# Patient Record
Sex: Female | Born: 1990 | Race: Asian | Hispanic: No | Marital: Married | Smoking: Never smoker
Health system: Southern US, Community
[De-identification: ages and names within clinical notes are randomized; demographics above are authoritative.]

## PROBLEM LIST (undated history)

## (undated) DIAGNOSIS — L309 Dermatitis, unspecified: Secondary | ICD-10-CM

## (undated) HISTORY — PX: ABDOMINAL SURGERY: SHX537

---

## 2011-06-26 ENCOUNTER — Encounter (HOSPITAL_COMMUNITY): Payer: Self-pay | Admitting: *Deleted

## 2011-06-26 ENCOUNTER — Emergency Department (HOSPITAL_COMMUNITY)
Admission: EM | Admit: 2011-06-26 | Discharge: 2011-06-26 | Disposition: A | Discharge status: Home / Self Care | Payer: Medicaid Other | Attending: Emergency Medicine | Admitting: Emergency Medicine

## 2011-06-26 DIAGNOSIS — L259 Unspecified contact dermatitis, unspecified cause: Secondary | ICD-10-CM | POA: Insufficient documentation

## 2011-06-26 DIAGNOSIS — L309 Dermatitis, unspecified: Secondary | ICD-10-CM

## 2011-06-26 HISTORY — DX: Dermatitis, unspecified: L30.9

## 2011-06-26 MED ORDER — HYDROXYZINE PAMOATE 25 MG PO CAPS: ORAL_CAPSULE | ORAL | Status: DC

## 2011-06-26 MED ORDER — DIPHENHYDRAMINE HCL 25 MG PO CAPS
25.0000 mg | ORAL_CAPSULE | Freq: Once | ORAL | Status: AC
  Administered 2011-06-26: 25 mg via ORAL
  Filled 2011-06-26: qty 1

## 2011-06-26 MED ORDER — PREDNISONE 10 MG PO TABS: ORAL_TABLET | ORAL | Status: DC

## 2011-06-26 MED ORDER — METHYLPREDNISOLONE SODIUM SUCC 125 MG IJ SOLR
125.0000 mg | Freq: Once | INTRAMUSCULAR | Status: AC
  Administered 2011-06-26: 125 mg via INTRAMUSCULAR
  Filled 2011-06-26: qty 2

## 2011-06-26 MED ORDER — METHYLPREDNISOLONE SODIUM SUCC 125 MG IJ SOLR: 125.0000 mg | Freq: Once | INTRAMUSCULAR | Status: DC

## 2011-06-26 NOTE — Discharge Instructions (Signed)

## 2011-06-26 NOTE — ED Provider Notes (Signed)
History     CSN: [id number]  Arrival date & time 06/26/11  1645   None     Chief Complaint  Patient presents with  . Rash    (Consider location/radiation/quality/duration/timing/severity/associated sxs/prior treatment) Patient is a 21 y.o. female presenting with rash. The history is provided by the patient.  Rash  This is a new problem. The current episode started 2 days ago. The problem has been gradually worsening. Associated with: history of eczema. There has been no fever. The pain is moderate. Pain course: itching. Associated symptoms include itching. Pertinent negatives include no pain and no weeping. She has tried nothing for the symptoms. The treatment provided no relief.    Past Medical History  Diagnosis Date  . Eczema     Past Surgical History  Procedure Date  . Abdominal surgery     No family history on file.  History  Substance Use Topics  . Smoking status: Never Smoker   . Smokeless tobacco: Not on file  . Alcohol Use: No    OB History    Grav Para Term Preterm Abortions TAB SAB Ect Mult Living                  Review of Systems  Constitutional: Negative for activity change.       All ROS Neg except as noted in HPI  HENT: Negative for nosebleeds and neck pain.   Eyes: Negative for photophobia and discharge.  Respiratory: Negative for cough, shortness of breath and wheezing.   Cardiovascular: Negative for chest pain and palpitations.  Gastrointestinal: Negative for abdominal pain and blood in stool.  Genitourinary: Negative for dysuria, frequency and hematuria.  Musculoskeletal: Negative for back pain and arthralgias.  Skin: Positive for itching and rash.  Neurological: Negative for dizziness, seizures and speech difficulty.  Psychiatric/Behavioral: Negative for hallucinations and confusion.    Allergies  Review of patient's allergies indicates no known allergies.  Home Medications  No current outpatient prescriptions on file.  BP 106/58   Pulse 75  Temp(Src) 98.3 F (36.8 C) (Oral)  Resp 20  Ht 5' (1.524 m)  Wt 125 lb 9.6 oz (56.972 kg)  BMI 24.53 kg/m2  SpO2 99%  LMP 06/16/2011  Physical Exam  Nursing note and vitals reviewed. Constitutional: She is oriented to person, place, and time. She appears well-developed and well-nourished.  Non-toxic appearance.  HENT:  Head: Normocephalic.  Right Ear: Tympanic membrane and external ear normal.  Left Ear: Tympanic membrane and external ear normal.  Eyes: EOM and lids are normal. Pupils are equal, round, and reactive to light.  Neck: Normal range of motion. Neck supple. Carotid bruit is not present.       Rash on the back of the neck. No active hives present.  Cardiovascular: Normal rate, regular rhythm, normal heart sounds, intact distal pulses and normal pulses.   Pulmonary/Chest: Breath sounds normal. No respiratory distress.  Abdominal: Soft. Bowel sounds are normal. There is no tenderness. There is no guarding.  Musculoskeletal: Normal range of motion.       Red papular rash of the right and left inner thighes. No pubis involvement. Chaperone in room during exam.  Lymphadenopathy:       Head (right side): No submandibular adenopathy present.       Head (left side): No submandibular adenopathy present.    She has no cervical adenopathy.  Neurological: She is alert and oriented to person, place, and time. She has normal strength. No cranial nerve deficit  or sensory deficit.  Skin: Skin is warm and dry. Rash noted.  Psychiatric: She has a normal mood and affect. Her speech is normal.    ED Course  Procedures (including critical care time)  Labs Reviewed - No data to display No results found.   No diagnosis found.    MDM  I have reviewed nursing notes, vital signs, and all appropriate lab and imaging results for this patient.  Rx Prednisone and Vistaril given. Pt to see dermatologist if not improving.      Kathie Dike, [place] 06/26/11 [phone]

## 2011-06-26 NOTE — ED Notes (Signed)
Hx of eczema - reports rash to medial bil thighs and back of neck x 2 days with itching and burning.

## 2011-06-26 NOTE — ED Notes (Signed)
Pt has moved from out of town and has hx of eczema, c/o of it spreading to inner thighs, tried to be seen at Urgent Care and was told to come here for evaluation

## 2011-06-28 NOTE — ED Provider Notes (Signed)
Medical screening examination/treatment/procedure(s) were performed by non-physician practitioner and as supervising physician I was immediately available for consultation/collaboration.  Dezeray Puccio, MD 06/28/11 1409 

## 2016-01-11 ENCOUNTER — Encounter (HOSPITAL_COMMUNITY): Payer: Self-pay | Admitting: Emergency Medicine

## 2016-01-11 ENCOUNTER — Emergency Department (HOSPITAL_COMMUNITY)
Admission: EM | Admit: 2016-01-11 | Discharge: 2016-01-11 | Disposition: A | Discharge status: Home / Self Care | Payer: Medicaid Other | Attending: Emergency Medicine | Admitting: Emergency Medicine

## 2016-01-11 DIAGNOSIS — T50905A Adverse effect of unspecified drugs, medicaments and biological substances, initial encounter: Secondary | ICD-10-CM

## 2016-01-11 DIAGNOSIS — Z79899 Other long term (current) drug therapy: Secondary | ICD-10-CM | POA: Insufficient documentation

## 2016-01-11 DIAGNOSIS — T43202A Poisoning by unspecified antidepressants, intentional self-harm, initial encounter: Secondary | ICD-10-CM | POA: Diagnosis not present

## 2016-01-11 DIAGNOSIS — T7840XA Allergy, unspecified, initial encounter: Secondary | ICD-10-CM | POA: Diagnosis present

## 2016-01-11 LAB — BASIC METABOLIC PANEL
Anion gap: 6 (ref 5–15)
BUN: 17 mg/dL (ref 6–20)
CALCIUM: 8.9 mg/dL (ref 8.9–10.3)
CO2: 22 mmol/L (ref 22–32)
CREATININE: 0.73 mg/dL (ref 0.44–1.00)
Chloride: 102 mmol/L (ref 101–111)
GFR calc Af Amer: >60 mL/min (ref >60)
GLUCOSE: 126 mg/dL — AB (ref 65–99)
Potassium: 3.2 mmol/L — ABNORMAL LOW (ref 3.5–5.1)
SODIUM: 130 mmol/L — AB (ref 135–145)

## 2016-01-11 LAB — CBC WITH DIFFERENTIAL/PLATELET
BASOS ABS: 0.1 10*3/uL (ref 0.0–0.1)
Basophils Relative: 1 %
EOS ABS: 0 10*3/uL (ref 0.0–0.7)
EOS PCT: 0 %
HCT: 39.3 % (ref 36.0–46.0)
Hemoglobin: 13.7 g/dL (ref 12.0–15.0)
LYMPHS PCT: 12 %
Lymphs Abs: 1.2 10*3/uL (ref 0.7–4.0)
MCH: 30.5 pg (ref 26.0–34.0)
MCHC: 34.9 g/dL (ref 30.0–36.0)
MCV: 87.5 fL (ref 78.0–100.0)
MONO ABS: 0.4 10*3/uL (ref 0.1–1.0)
Monocytes Relative: 4 %
Neutro Abs: 8.1 10*3/uL — ABNORMAL HIGH (ref 1.7–7.7)
Neutrophils Relative %: 83 %
PLATELETS: 289 10*3/uL (ref 150–400)
RBC: 4.49 MIL/uL (ref 3.87–5.11)
RDW: 11.9 % (ref 11.5–15.5)
WBC: 9.7 10*3/uL (ref 4.0–10.5)

## 2016-01-11 LAB — URINALYSIS, ROUTINE W REFLEX MICROSCOPIC
BILIRUBIN URINE: NEGATIVE
Glucose, UA: NEGATIVE mg/dL
HGB URINE DIPSTICK: NEGATIVE
KETONES UR: NEGATIVE mg/dL
NITRITE: NEGATIVE
PROTEIN: NEGATIVE mg/dL
pH: 5.5 (ref 5.0–8.0)

## 2016-01-11 LAB — URINE MICROSCOPIC-ADD ON: RBC / HPF: NONE SEEN RBC/hpf (ref 0–5)

## 2016-01-11 LAB — PREGNANCY, URINE: PREG TEST UR: NEGATIVE

## 2016-01-11 MED ORDER — DIPHENHYDRAMINE HCL 25 MG PO CAPS
ORAL_CAPSULE | ORAL | Status: AC
  Filled 2016-01-11: qty 1

## 2016-01-11 MED ORDER — SODIUM CHLORIDE 0.9 % IV BOLUS (SEPSIS)
1000.0000 mL | Freq: Once | INTRAVENOUS | Status: AC
  Administered 2016-01-11: 1000 mL via INTRAVENOUS

## 2016-01-11 MED ORDER — ONDANSETRON HCL 4 MG/2ML IJ SOLN
4.0000 mg | Freq: Once | INTRAMUSCULAR | Status: AC
  Administered 2016-01-11: 4 mg via INTRAVENOUS
  Filled 2016-01-11: qty 2

## 2016-01-11 MED ORDER — DIPHENHYDRAMINE HCL 25 MG PO CAPS
25.0000 mg | ORAL_CAPSULE | Freq: Once | ORAL | Status: AC
  Administered 2016-01-11: 25 mg via ORAL

## 2016-01-11 MED ORDER — LORAZEPAM 2 MG/ML IJ SOLN
0.5000 mg | Freq: Once | INTRAMUSCULAR | Status: AC
  Administered 2016-01-11: 0.5 mg via INTRAVENOUS
  Filled 2016-01-11: qty 1

## 2016-01-11 NOTE — ED Triage Notes (Signed)
Pt reports possible allergic reaction to Zoloft, started taking today. Pt states she feels like "her throat is closing up. Pt 100 % on RA. Pt able to speak clearly. No stridor heard on auscultation. Lung sounds clear.

## 2016-01-11 NOTE — ED Provider Notes (Signed)
[facility]PT Provider Note   CSN: [id number] Arrival date & time: 01/11/16  2015  By signing my name below, I, Linna DarnerRussell Turner, attest that this documentation has been prepared under the direction and in the presence of physician practitioner, Jacalyn LefevreJulie Alveena Taira, MD. Electronically Signed: Linna Darnerussell Turner, Scribe. 01/11/2016. 8:59 PM.  History   Chief Complaint Chief Complaint  Patient presents with  . Allergic Reaction    The history is provided by the patient. No language interpreter was used.     HPI Comments: Hannah Singh is a 25 y.o. female who presents to the Emergency Department complaining of allergic reaction s/p taking Zoloft several hours ago. Pt reports she took Zoloft for the first time this evening and felt like she was going to pass out shortly after taking it; she endorses numbness, lightheadedness, nausea, and palpitations. She states it felt like her throat was closing up but her throat is starting to feel better. She notes she has had intermittent nausea and palpitations since arrival to the ED. She states she still feels numbness in her arms. No treatments or medications tried PTA. LMP October 8th. She denies vomiting, fever, chills, or any other associated symptoms.  Past Medical History:  Diagnosis Date  . Eczema     There are no active problems to display for this patient.   Past Surgical History:  Procedure Laterality Date  . ABDOMINAL SURGERY      OB History    Gravida Para Term Preterm AB Living   2 2 1 1        SAB TAB Ectopic Multiple Live Births                   Home Medications    Prior to Admission medications   Medication Sig Start Date End Date Taking? Authorizing Provider  sertraline (ZOLOFT) 50 MG tablet Take 50 mg by mouth daily.   Yes Historical Provider, MD    Family History History reviewed. No pertinent family history.  Social History Social History  Substance Use Topics  . Smoking status: Never Smoker  . Smokeless  tobacco: Never Used  . Alcohol use No     Allergies   Review of patient's allergies indicates no known allergies.   Review of Systems Review of Systems  Constitutional: Negative for chills and fever.  Cardiovascular: Positive for palpitations.  Gastrointestinal: Positive for nausea. Negative for vomiting.  Neurological: Positive for light-headedness and numbness.  All other systems reviewed and are negative.   Physical Exam Updated Vital Signs BP 110/60   Pulse 109   Temp 97.3 F (36.3 C)   Resp 23   Ht 5' (1.524 m)   Wt 123 lb (55.8 kg)   LMP 12/17/2015   SpO2 99%   BMI 24.02 kg/m   Physical Exam  Constitutional: She is oriented to person, place, and time. She appears well-developed and well-nourished. No distress.  HENT:  Head: Normocephalic and atraumatic.  Eyes: Conjunctivae and EOM are normal.  Neck: Neck supple. No tracheal deviation present.  Cardiovascular: Normal rate.   Pulmonary/Chest: Effort normal. No respiratory distress.  Musculoskeletal: Normal range of motion.  Neurological: She is alert and oriented to person, place, and time.  Skin: Skin is warm and dry.  Psychiatric: She has a normal mood and affect. Her behavior is normal.  Nursing note and vitals reviewed.    ED Treatments / Results  Labs (all labs ordered are listed, but only abnormal results are displayed) Labs Reviewed  URINALYSIS, ROUTINE  W REFLEX MICROSCOPIC (NOT AT [facility]) - Abnormal; Notable for the following:       Result Value   Specific Gravity, Urine <1.005 (*)    Leukocytes, UA TRACE (*)    All other components within normal limits  BASIC METABOLIC PANEL - Abnormal; Notable for the following:    Sodium 130 (*)    Potassium 3.2 (*)    Glucose, Bld 126 (*)    All other components within normal limits  CBC WITH DIFFERENTIAL/PLATELET - Abnormal; Notable for the following:    Neutro Abs 8.1 (*)    All other components within normal limits  URINE MICROSCOPIC-ADD ON - Abnormal;  Notable for the following:    Squamous Epithelial / LPF 6-30 (*)    Bacteria, UA RARE (*)    All other components within normal limits  PREGNANCY, URINE    EKG  EKG Interpretation None       Radiology No results found.  Procedures Procedures (including critical care time)  DIAGNOSTIC STUDIES: Oxygen Saturation is 100% on RA, normal by my interpretation.    COORDINATION OF CARE: 8:59 PM Discussed treatment plan with pt at bedside and pt agreed to plan.  Medications Ordered in ED Medications  sodium chloride 0.9 % bolus 1,000 mL (1,000 mLs Intravenous New Bag/Given 01/11/16 2130)  LORazepam (ATIVAN) injection 0.5 mg (0.5 mg Intravenous Given 01/11/16 2129)  ondansetron (ZOFRAN) injection 4 mg (4 mg Intravenous Given 01/11/16 2130)     Initial Impression / Assessment and Plan / ED Course  I have reviewed the triage vital signs and the nursing notes.  Pertinent labs & imaging results that were available during my care of the patient were reviewed by me and considered in my medical decision making (see chart for details).  Clinical Course   Pt is improving.  She knows to return if worse.  I personally performed the services described in this documentation, which was scribed in my presence. The recorded information has been reviewed and is accurate.   Final Clinical Impressions(s) / ED Diagnoses   Final diagnoses:  Medication reaction, initial encounter    New Prescriptions New Prescriptions   No medications on file     Jacalyn LefevreJulie Letti Towell, MD 01/11/16 2223

## 2016-01-11 NOTE — ED Notes (Signed)
Pt with hives to area of plastic  tape after taking her IV out, pt c/o itching.  EDP made aware and has seen pt's hives

## 2016-04-10 ENCOUNTER — Telehealth (HOSPITAL_COMMUNITY): Payer: Self-pay | Admitting: *Deleted

## 2016-04-10 NOTE — Telephone Encounter (Signed)
spoke with patient regarding an appointment, she said she is feeling better and do not wish to schedule at this time.

## 2016-04-10 NOTE — Telephone Encounter (Signed)
spoke with patient

## 2016-08-01 ENCOUNTER — Other Ambulatory Visit (HOSPITAL_COMMUNITY): Payer: Self-pay

## 2016-08-01 ENCOUNTER — Emergency Department (HOSPITAL_COMMUNITY): Payer: Medicaid Other

## 2016-08-01 ENCOUNTER — Encounter (HOSPITAL_COMMUNITY): Payer: Self-pay | Admitting: Emergency Medicine

## 2016-08-01 ENCOUNTER — Emergency Department (HOSPITAL_COMMUNITY)
Admission: EM | Admit: 2016-08-01 | Discharge: 2016-08-01 | Disposition: A | Discharge status: Home / Self Care | Payer: Medicaid Other | Attending: Emergency Medicine | Admitting: Emergency Medicine

## 2016-08-01 DIAGNOSIS — M542 Cervicalgia: Secondary | ICD-10-CM | POA: Insufficient documentation

## 2016-08-01 DIAGNOSIS — Z79899 Other long term (current) drug therapy: Secondary | ICD-10-CM | POA: Insufficient documentation

## 2016-08-01 LAB — CBC WITH DIFFERENTIAL/PLATELET
BASOS PCT: 0 %
Basophils Absolute: 0 10*3/uL (ref 0.0–0.1)
EOS ABS: 0.1 10*3/uL (ref 0.0–0.7)
EOS PCT: 1 %
HEMATOCRIT: 43.5 % (ref 36.0–46.0)
Hemoglobin: 15 g/dL (ref 12.0–15.0)
Lymphocytes Relative: 17 %
Lymphs Abs: 1.5 10*3/uL (ref 0.7–4.0)
MCH: 30.5 pg (ref 26.0–34.0)
MCHC: 34.5 g/dL (ref 30.0–36.0)
MCV: 88.6 fL (ref 78.0–100.0)
MONOS PCT: 5 %
Monocytes Absolute: 0.5 10*3/uL (ref 0.1–1.0)
Neutro Abs: 6.9 10*3/uL (ref 1.7–7.7)
Neutrophils Relative %: 77 %
PLATELETS: 320 10*3/uL (ref 150–400)
RBC: 4.91 MIL/uL (ref 3.87–5.11)
RDW: 12.2 % (ref 11.5–15.5)
WBC: 9.1 10*3/uL (ref 4.0–10.5)

## 2016-08-01 LAB — BASIC METABOLIC PANEL
Anion gap: 7 (ref 5–15)
BUN: 11 mg/dL (ref 6–20)
CALCIUM: 9.2 mg/dL (ref 8.9–10.3)
CO2: 28 mmol/L (ref 22–32)
CREATININE: 0.76 mg/dL (ref 0.44–1.00)
Chloride: 102 mmol/L (ref 101–111)
GFR calc non Af Amer: >60 mL/min (ref >60)
Glucose, Bld: 101 mg/dL — ABNORMAL HIGH (ref 65–99)
Potassium: 3.8 mmol/L (ref 3.5–5.1)
SODIUM: 137 mmol/L (ref 135–145)

## 2016-08-01 LAB — TSH: TSH: 0.412 u[IU]/mL (ref 0.350–4.500)

## 2016-08-01 NOTE — Discharge Instructions (Signed)
Tests including blood work and ultrasound showed no life-threatening condition. Follow-up your primary care doctor

## 2016-08-01 NOTE — ED Triage Notes (Signed)
Pt states 2 weeks ago felt a pop with pain to right side of neck. Pain since with shooting pains down r side of neck to right chest with lightheadedness today. nad at this time. Denies injury

## 2016-08-01 NOTE — ED Notes (Signed)
Patient transported to Ultrasound 

## 2016-08-01 NOTE — ED Provider Notes (Signed)
[facility] Provider Note   CSN: [id number] Arrival date & time: 08/01/16  1342     History   Chief Complaint Chief Complaint  Patient presents with  . Neck Pain    HPI Hannah Singh is a 26 y.o. female.  Patient complains of pain in her anterior neck for 2 weeks. Her neck feels full. No meningeal signs. No fever, sweats, chills. She is normally healthy. No history of thyroid pathology. Symptoms started with a popping sensation in the right side of her neck.  No chest pain or dyspnea. Review systems positive for intermittent rare dizziness.  Past medical history negative      Past Medical History:  Diagnosis Date  . Eczema     There are no active problems to display for this patient.   Past Surgical History:  Procedure Laterality Date  . ABDOMINAL SURGERY    . ABDOMINAL SURGERY     "gastroscehsis"    OB History    Gravida Para Term Preterm AB Living   2 2 1 1        SAB TAB Ectopic Multiple Live Births                   Home Medications    Prior to Admission medications   Medication Sig Start Date End Date Taking? Authorizing Provider  cycloSPORINE (RESTASIS) 0.05 % ophthalmic emulsion Place 1 drop into both eyes 2 (two) times daily.   Yes [provider]  loratadine-pseudoephedrine (CLARITIN-D 24-HOUR) 10-240 MG 24 hr tablet Take 1 tablet by mouth daily.   Yes [provider]  Tetrahydrozoline HCl (EYE DROPS OP) Apply 1 drop to eye daily. Dry eyes   Yes [provider]    Family History History reviewed. No pertinent family history.  Social History Social History  Substance Use Topics  . Smoking status: Never Smoker  . Smokeless tobacco: Never Used  . Alcohol use No     Allergies   Sertraline and Tape   Review of Systems Review of Systems  All other systems reviewed and are negative.    Physical Exam Updated Vital Signs BP 128/71 (BP Location: Right Arm)   Pulse 87   Temp 98.4 F (36.9 C) (Oral)    Resp 16   Ht 5' (1.524 m)   Wt 56.2 kg (124 lb)   LMP 07/25/2016   SpO2 99%   BMI 24.22 kg/m   Physical Exam  Constitutional: She is oriented to person, place, and time. She appears well-developed and well-nourished.  HENT:  Head: Normocephalic and atraumatic.  Eyes: Conjunctivae are normal.  Neck: Neck supple.  No nodes or masses noted in the anterior neck. No meningeal signs  Cardiovascular: Normal rate and regular rhythm.   Pulmonary/Chest: Effort normal and breath sounds normal.  Abdominal: Soft. Bowel sounds are normal.  Musculoskeletal: Normal range of motion.  Neurological: She is alert and oriented to person, place, and time.  Skin: Skin is warm and dry.  Psychiatric: She has a normal mood and affect. Her behavior is normal.  Nursing note and vitals reviewed.    ED Treatments / Results  Labs (all labs ordered are listed, but only abnormal results are displayed) Labs Reviewed  BASIC METABOLIC PANEL - Abnormal; Notable for the following:       Result Value   Glucose, Bld 101 (*)    All other components within normal limits  CBC WITH DIFFERENTIAL/PLATELET  TSH    EKG  EKG Interpretation None  Radiology [place] Soft Tissue Neck  Result Date: 08/01/2016 CLINICAL DATA:  26 year old female with a history of swelling right mid anterior neck EXAM: ULTRASOUND OF HEAD/NECK SOFT TISSUES TECHNIQUE: Ultrasound examination of the head and neck soft tissues was performed in the area of clinical concern. COMPARISON:  None. FINDINGS: Grayscale and color duplex ultrasound performed in the region of clinical concern of the right anterior neck. No focal fluid collection. No soft tissue lesion. Normal vasculature identified. Lymph nodes are present, not enlarged. IMPRESSION: Unremarkable sonographic survey of the right neck in the region of clinical concern. Electronically Signed   By: Gilmer MorJaime  Wagner D.O.   On: 08/01/2016 15:37    Procedures Procedures (including critical care  time)  Medications Ordered in ED Medications - No data to display   Initial Impression / Assessment and Plan / ED Course  I have reviewed the triage vital signs and the nursing notes.  Pertinent labs & imaging results that were available during my care of the patient were reviewed by me and considered in my medical decision making (see chart for details).     Patient is in no acute distress. Screening tests including ultrasound, labs, TSH all negative. This was discussed with the patient.  Final Clinical Impressions(s) / ED Diagnoses   Final diagnoses:  Neck pain    New Prescriptions New Prescriptions   No medications on file     Donnetta Hutchingook, Keala Drum, MD 08/01/16 1710

## 2017-10-05 ENCOUNTER — Emergency Department (HOSPITAL_COMMUNITY)

## 2017-10-05 ENCOUNTER — Emergency Department (HOSPITAL_COMMUNITY)
Admission: EM | Admit: 2017-10-05 | Discharge: 2017-10-05 | Disposition: A | Discharge status: Home / Self Care | Attending: Emergency Medicine | Admitting: Emergency Medicine

## 2017-10-05 ENCOUNTER — Encounter (HOSPITAL_COMMUNITY): Payer: Self-pay | Admitting: *Deleted

## 2017-10-05 DIAGNOSIS — Y998 Other external cause status: Secondary | ICD-10-CM | POA: Diagnosis not present

## 2017-10-05 DIAGNOSIS — Y9301 Activity, walking, marching and hiking: Secondary | ICD-10-CM | POA: Diagnosis not present

## 2017-10-05 DIAGNOSIS — S93602A Unspecified sprain of left foot, initial encounter: Secondary | ICD-10-CM | POA: Diagnosis not present

## 2017-10-05 DIAGNOSIS — Y929 Unspecified place or not applicable: Secondary | ICD-10-CM | POA: Insufficient documentation

## 2017-10-05 DIAGNOSIS — S99912A Unspecified injury of left ankle, initial encounter: Secondary | ICD-10-CM | POA: Diagnosis present

## 2017-10-05 DIAGNOSIS — Y33XXXA Other specified events, undetermined intent, initial encounter: Secondary | ICD-10-CM | POA: Diagnosis not present

## 2017-10-05 MED ORDER — IBUPROFEN 600 MG PO TABS: 600.0000 mg | ORAL_TABLET | Freq: Four times a day (QID) | ORAL | 0 refills | Status: DC | PRN

## 2017-10-05 NOTE — ED Provider Notes (Signed)
[facility]ENCY DEPART[facility] Provider Note   CSN: [id number] Arrival date & time: 10/05/17  1133     History   Chief Complaint Chief Complaint  Patient presents with  . Ankle Pain    HPI Hannah Singh is a 27 y.o. female  Presenting with left foot and ankle pain which occurred suddenly when the patient inverted her left foot and ankle when walking wearing flip flops on gravel. Her foot slid forward when the inversion occurred last night.  Pain is aching, constant and worse with palpation, movement and weight bearing.  The patient was able to weight bear immediately after the event but is more sore today.  There is no radiation of pain and the patient denies numbness distal to the injury site.  The patients treatment prior to arrival included ice and rest. .  The history is provided by the patient.    Past Medical History:  Diagnosis Date  . Eczema     There are no active problems to display for this patient.   Past Surgical History:  Procedure Laterality Date  . ABDOMINAL SURGERY    . ABDOMINAL SURGERY     "gastroscehsis"     OB History    Gravida  2   Para  2   Term  1   Preterm  1   AB      Living        SAB      TAB      Ectopic      Multiple      Live Births               Home Medications    Prior to Admission medications   Medication Sig Start Date End Date Taking? Authorizing Provider  hydrocortisone cream 1 % Apply 1 application topically 2 (two) times daily.   Yes [provider]  ibuprofen (ADVIL,MOTRIN) 600 MG tablet Take 1 tablet (600 mg total) by mouth every 6 (six) hours as needed. 10/05/17   Burgess AmorIdol, Kamiryn Bezanson, PA-C    Family History History reviewed. No pertinent family history.  Social History Social History   Tobacco Use  . Smoking status: Never Smoker  . Smokeless tobacco: Never Used  Substance Use Topics  . Alcohol use: No  . Drug use: No     Allergies   Sertraline and Tape   Review of  Systems Review of Systems  Musculoskeletal: Positive for arthralgias. Negative for joint swelling.  Skin: Negative for wound.  Neurological: Negative for weakness and numbness.     Physical Exam Updated Vital Signs BP 114/61 (BP Location: Right Arm)   Pulse 69   Temp 98.9 F (37.2 C) (Oral)   Resp 16   Ht 5' (1.524 m)   Wt 59.4 kg (131 lb)   LMP 09/21/2017   SpO2 99%   BMI 25.58 kg/m   Physical Exam  Constitutional: She appears well-developed and well-nourished.  HENT:  Head: Normocephalic.  Cardiovascular: Normal rate and intact distal pulses. Exam reveals no decreased pulses.  Pulses:      Dorsalis pedis pulses are 2+ on the right side, and 2+ on the left side.       Posterior tibial pulses are 2+ on the right side, and 2+ on the left side.  Musculoskeletal: She exhibits edema and tenderness.       Left ankle: She exhibits no swelling, no ecchymosis, no deformity and normal pulse. Tenderness. Lateral malleolus tenderness found. No head of 5th metatarsal  and no proximal fibula tenderness found. Achilles tendon normal.       Left foot: There is bony tenderness.       Feet:  Point bony tenderness left lateral midfoot.  Small area of ecchymosis at this site.  Also ecchymosis noted plantar 5th metatarsal head.   Cap refill less than 2 sec in toes.  No edema. Dorsalis pedis pulse intact.  Neurological: She is alert. No sensory deficit.  Skin: Skin is warm, dry and intact.  Nursing note and vitals reviewed.    ED Treatments / Results  Labs (all labs ordered are listed, but only abnormal results are displayed) Labs Reviewed - No data to display  EKG None  Radiology Dg Ankle Complete Left  Result Date: 10/05/2017 CLINICAL DATA:  Twisting injury with ankle pain, initial encounter EXAM: LEFT ANKLE COMPLETE - 3+ VIEW COMPARISON:  None. FINDINGS: No acute fracture or dislocation is noted. No soft tissue changes are seen. IMPRESSION: No acute abnormality noted. Electronically  Signed   By: Alcide Clever M.D.   On: 10/05/2017 12:30   Dg Foot Complete Left  Result Date: 10/05/2017 CLINICAL DATA:  Twisting injury yesterday with persistent foot pain, initial encounter EXAM: LEFT FOOT - COMPLETE 3+ VIEW COMPARISON:  None. FINDINGS: There is no evidence of fracture or dislocation. There is no evidence of arthropathy or other focal bone abnormality. Soft tissues are unremarkable. IMPRESSION: No acute abnormality noted. Electronically Signed   By: Alcide Clever M.D.   On: 10/05/2017 12:31    Procedures Procedures (including critical care time)  Medications Ordered in ED Medications - No data to display   Initial Impression / Assessment and Plan / ED Course  I have reviewed the triage vital signs and the nursing notes.  Pertinent labs & imaging results that were available during my care of the patient were reviewed by me and considered in my medical decision making (see chart for details).     RICE, elevation, ibuprofen.  F/u with pcp in 1 week if not improving.  Imaging reviewed and discussed with pt.  Final Clinical Impressions(s) / ED Diagnoses   Final diagnoses:  Sprain of left foot, initial encounter    ED Discharge Orders        Ordered    ibuprofen (ADVIL,MOTRIN) 600 MG tablet  Every 6 hours PRN     10/05/17 1332       Burgess Amor, PA-C 10/05/17 1336    Samuel Jester, DO 10/08/17 2119

## 2017-10-05 NOTE — ED Notes (Signed)
Walking into house last night with child and car seat when turned L ankle foot inward Iced and elevated throughout the night  Now painful to ambulate and unable to bear weight  Slight swelling to L ankle area  Awaiting rad

## 2017-10-05 NOTE — Discharge Instructions (Signed)
Wear the brace to protect your foot and ankle while the sprain continues to heal (wear at all times while awake until pain is gone).  Use ice and elevation as much as possible for the next several days to help reduce the swelling.   Call your doctor for a recheck of your injury in 1 week if your symptoms are not improving by then.

## 2017-10-05 NOTE — ED Triage Notes (Signed)
Pt rolled her foot last night on left.  Pt unable to put pressure on it.

## 2017-10-05 NOTE — ED Notes (Signed)
Awaiting eval  

## 2017-10-05 NOTE — ED Notes (Signed)
ua to DC in computer as chart is open elsewhere

## 2017-11-17 ENCOUNTER — Other Ambulatory Visit: Payer: Self-pay

## 2017-11-17 ENCOUNTER — Emergency Department (HOSPITAL_COMMUNITY)

## 2017-11-17 ENCOUNTER — Emergency Department (HOSPITAL_COMMUNITY)
Admission: EM | Admit: 2017-11-17 | Discharge: 2017-11-17 | Disposition: A | Discharge status: Home / Self Care | Attending: Emergency Medicine | Admitting: Emergency Medicine

## 2017-11-17 ENCOUNTER — Encounter (HOSPITAL_COMMUNITY): Payer: Self-pay | Admitting: Emergency Medicine

## 2017-11-17 DIAGNOSIS — R002 Palpitations: Secondary | ICD-10-CM | POA: Diagnosis not present

## 2017-11-17 DIAGNOSIS — R0602 Shortness of breath: Secondary | ICD-10-CM | POA: Diagnosis not present

## 2017-11-17 DIAGNOSIS — R079 Chest pain, unspecified: Secondary | ICD-10-CM | POA: Diagnosis present

## 2017-11-17 LAB — COMPREHENSIVE METABOLIC PANEL
ALBUMIN: 4.3 g/dL (ref 3.5–5.0)
ALK PHOS: 76 U/L (ref 38–126)
ALT: 16 U/L (ref 0–44)
ANION GAP: 8 (ref 5–15)
AST: 21 U/L (ref 15–41)
BILIRUBIN TOTAL: 0.5 mg/dL (ref 0.3–1.2)
BUN: 11 mg/dL (ref 6–20)
CALCIUM: 8.9 mg/dL (ref 8.9–10.3)
CO2: 25 mmol/L (ref 22–32)
Chloride: 105 mmol/L (ref 98–111)
Creatinine, Ser: 0.58 mg/dL (ref 0.44–1.00)
GFR calc non Af Amer: >60 mL/min (ref >60)
GLUCOSE: 105 mg/dL — AB (ref 70–99)
Potassium: 3.8 mmol/L (ref 3.5–5.1)
SODIUM: 138 mmol/L (ref 135–145)
TOTAL PROTEIN: 7.5 g/dL (ref 6.5–8.1)

## 2017-11-17 LAB — CBC
HEMATOCRIT: 41.8 % (ref 36.0–46.0)
HEMOGLOBIN: 14.3 g/dL (ref 12.0–15.0)
MCH: 29.7 pg (ref 26.0–34.0)
MCHC: 34.2 g/dL (ref 30.0–36.0)
MCV: 86.7 fL (ref 78.0–100.0)
Platelets: 294 10*3/uL (ref 150–400)
RBC: 4.82 MIL/uL (ref 3.87–5.11)
RDW: 12.1 % (ref 11.5–15.5)
WBC: 6.3 10*3/uL (ref 4.0–10.5)

## 2017-11-17 LAB — I-STAT BETA HCG BLOOD, ED (MC, WL, AP ONLY): I-stat hCG, quantitative: <5 m[IU]/mL (ref <5)

## 2017-11-17 LAB — I-STAT TROPONIN, ED: TROPONIN I, POC: 0 ng/mL (ref 0.00–0.08)

## 2017-11-17 LAB — BRAIN NATRIURETIC PEPTIDE: B Natriuretic Peptide: 57 pg/mL (ref 0.0–100.0)

## 2017-11-17 LAB — D-DIMER, QUANTITATIVE: D-Dimer, Quant: 0.33 ug/mL-FEU (ref 0.00–0.50)

## 2017-11-17 MED ORDER — KETOROLAC TROMETHAMINE 30 MG/ML IJ SOLN
30.0000 mg | Freq: Once | INTRAMUSCULAR | Status: AC
  Administered 2017-11-17: 30 mg via INTRAVENOUS
  Filled 2017-11-17: qty 1

## 2017-11-17 MED ORDER — ATENOLOL 25 MG PO TABS: 25.0000 mg | ORAL_TABLET | ORAL | 0 refills | Status: DC | PRN

## 2017-11-17 NOTE — ED Triage Notes (Signed)
Pt c/o of left sided chest pain that radiates to left shoulder x 2 weeks.  Pt states "I feel like my heart is racing"

## 2017-11-17 NOTE — ED Provider Notes (Signed)
Emergency Department Provider Note   I have reviewed the triage vital signs and the nursing notes.   HISTORY  Chief Complaint Chest Pain   HPI Hannah Singh is a 27 y.o. female without significant past medical history the presents to the emergency department today with a couple weeks of chest pain or palpitations.  Patient states that her and her been recently flew to [place] and back and after that patient woke up with rotations and melanite.  And ever since then she is been having chest pain and intermittent palpitations.  She states that that night when she woke up her heart rate was as high as 185 on her watch.  She never had that problem before.  Since then it will intermittently go high but she does not always check it.  She states that she has not had any leg swelling or pain.  No history of blood clots.  She does have anxiety but it seems to come after the symptoms start and not causing the symptoms.  She has some shortness of breath with it and some nausea but no vomiting.  No fever or cough.  She does have a family history of heart disease she thinks that some people had heart issues in their 30s.  No trauma or musculoskeletal causes for her pain.  She gave birth 4 months ago and is on her.  At this time.  She is not breast-feeding and she not had any abnormal breast discharge. No other associated or modifying symptoms.    Past Medical History:  Diagnosis Date  . Eczema     There are no active problems to display for this patient.   Past Surgical History:  Procedure Laterality Date  . ABDOMINAL SURGERY    . ABDOMINAL SURGERY     "gastroscehsis"  . STOMACH SURGERY      Current Outpatient Rx  . Order #: [id number] Class: Normal    Allergies Sertraline and Tape  No family history on file.  Social History Social History   Tobacco Use  . Smoking status: Never Smoker  . Smokeless tobacco: Never Used  Substance Use Topics  . Alcohol use: No  . Drug use: No     Review of Systems  All other systems negative except as documented in the HPI. All pertinent positives and negatives as reviewed in the HPI. ____________________________________________   PHYSICAL EXAM:  VITAL SIGNS: ED Triage Vitals  Enc Vitals Group     BP 11/17/17 0828 133/74     Pulse Rate 11/17/17 0828 (!) 104     Resp 11/17/17 0828 18     Temp 11/17/17 0828 98.5 F (36.9 C)     Temp Source 11/17/17 0828 Oral     SpO2 11/17/17 0828 100 %     Weight 11/17/17 0826 130 lb (59 kg)     Height 11/17/17 0826 5' (1.524 m)    Constitutional: Alert and oriented. Well appearing and in no acute distress. Eyes: Conjunctivae are normal. PERRL. EOMI. Head: Atraumatic. Nose: No congestion/rhinnorhea. Mouth/Throat: Mucous membranes are moist.  Oropharynx non-erythematous. Neck: No stridor.  No meningeal signs.   Cardiovascular: tachycardic rate, regular rhythm. Good peripheral circulation. Grossly normal heart sounds.   Respiratory: Normal respiratory effort.  No retractions. Lungs CTAB. Gastrointestinal: Soft and nontender. No distention.  Musculoskeletal: No lower extremity tenderness nor edema. No gross deformities of extremities. Neurologic:  Normal speech and language. No gross focal neurologic deficits are appreciated.  Skin:  Skin is warm, dry and  intact. No rash noted.   ____________________________________________   LABS (all labs ordered are listed, but only abnormal results are displayed)  Labs Reviewed  COMPREHENSIVE METABOLIC PANEL - Abnormal; Notable for the following components:      Result Value   Glucose, Bld 105 (*)    All other components within normal limits  CBC  BRAIN NATRIURETIC PEPTIDE  D-DIMER, QUANTITATIVE (NOT AT [facility])  I-STAT TROPONIN, ED  I-STAT BETA HCG BLOOD, ED ([facility], [facility], [facility] ONLY)   ____________________________________________  EKG   EKG Interpretation  Date/Time:  Monday November 17 2017 08:28:02 EDT Ventricular Rate:  106 PR  Interval:    QRS Duration: 84 QT Interval:  335 QTC Calculation: 445 R Axis:   130 Text Interpretation:  Sinus tachycardia Right axis deviation Minimal ST depression, inferior leads ST depressions similar to previously new T wave change in III since 2017 Confirmed by Marily Memos [id number]) on 11/17/2017 8:50:34 AM       ____________________________________________  RADIOLOGY  Dg Chest 2 View  Result Date: 11/17/2017 CLINICAL DATA:  Shortness of breath.  Left-sided chest pain. EXAM: CHEST - 2 VIEW COMPARISON:  None. FINDINGS: Cardiomediastinal silhouette is normal. Mediastinal contours appear intact. There is no evidence of focal airspace consolidation, pleural effusion or pneumothorax. Osseous structures are without acute abnormality. Soft tissues are grossly normal. IMPRESSION: No active cardiopulmonary disease. Electronically Signed   By: Ted Mcalpine M.D.   On: 11/17/2017 09:47    ____________________________________________    INITIAL IMPRESSION / ASSESSMENT AND PLAN / ED COURSE  Unclear etiology at this point.  Could be pericarditis versus myocarditis versus pulmonary embolus.  Less likely to be ACS however she really does have that early with family history of ACS will need to evaluate for the same.  EKG similar to previous EKGs with some possible T wave changes in lead III.  Will check troponin, d-dimer, CBC BMP and urine pregnancy test.  If all this is normal patient will likely need to follow with cardiology for Holter monitoring.  Workup here relatively reassuring.  Low suspicion for blood clot, ACS, pneumothorax or other acute causes for symptoms.  Possibly could be paroxysmal SVT.  We will have her follow-up cardiology of the same.  PRN atenolol until then.   Pertinent labs & imaging results that were available during my care of the patient were reviewed by me and considered in my medical decision making (see chart for  details).  ____________________________________________  FINAL CLINICAL IMPRESSION(S) / ED DIAGNOSES  Final diagnoses:  Palpitations     MEDICATIONS GIVEN DURING THIS VISIT:  Medications  ketorolac (TORADOL) 30 MG/ML injection 30 mg (30 mg Intravenous Given 11/17/17 0907)     NEW OUTPATIENT MEDICATIONS STARTED DURING THIS VISIT:  New Prescriptions   ATENOLOL (TENORMIN) 25 MG TABLET    Take 1 tablet (25 mg total) by mouth as needed (for prolonged palpitations).    Note:  This note was prepared with assistance of Dragon voice recognition software. Occasional wrong-word or sound-a-like substitutions may have occurred due to the inherent limitations of voice recognition software.   Marily Memos, MD 11/17/17 1304

## 2017-11-17 NOTE — ED Notes (Signed)
Patient transported to X-ray 

## 2017-11-17 NOTE — ED Notes (Signed)
Pt states she had an "episode" of how she felt earlier about 5 minutes ago.  Reviewed monitor and period of sinus tach noted with rate of 120s.  No pvcs.

## 2018-01-30 ENCOUNTER — Ambulatory Visit: Admitting: Allergy & Immunology

## 2018-02-16 ENCOUNTER — Encounter: Payer: Self-pay | Admitting: Cardiology

## 2018-02-16 ENCOUNTER — Ambulatory Visit (INDEPENDENT_AMBULATORY_CARE_PROVIDER_SITE_OTHER): Admitting: Cardiology

## 2018-02-16 VITALS — BP 108/58 | HR 78 | Ht 60.0 in | Wt 127.0 lb

## 2018-02-16 DIAGNOSIS — R0602 Shortness of breath: Secondary | ICD-10-CM

## 2018-02-16 DIAGNOSIS — R002 Palpitations: Secondary | ICD-10-CM | POA: Diagnosis not present

## 2018-02-16 DIAGNOSIS — R0789 Other chest pain: Secondary | ICD-10-CM | POA: Diagnosis not present

## 2018-02-16 NOTE — Patient Instructions (Addendum)
Medication Instructions:   NONE  Labwork:  NONE  Testing/Procedures: Your physician has requested that you have an echocardiogram. Echocardiography is a painless test that uses sound waves to create images of your heart. It provides your doctor with information about the size and shape of your heart and how well your heart's chambers and valves are working. This procedure takes approximately one hour. There are no restrictions for this procedure. Your physician has recommended that you wear an event monitor for 7 days. Event monitors are medical devices that record the heart's electrical activity. Doctors most often us these monitors to diagnose arrhythmias. Arrhythmias are problems with the speed or rhythm of the heartbeat. The monitor is a small, portable device. You can wear one while you do your normal daily activities. This is usually used to diagnose what is causing palpitations/syncope (passing out).  Follow-Up:  Your physician recommends that you schedule a follow-up appointment in: pending test results.  Any Other Special Instructions Will Be Listed Below (If Applicable).  If you need a refill on your cardiac medications before your next appointment, please call your pharmacy.

## 2018-02-16 NOTE — Progress Notes (Signed)
Cardiology Office Note  Date: 02/16/2018   ID: Hannah Singh, DOB [birth year], MRN [id number]  PCP: Estanislado Pandy, MD  Consulting Cardiologist: Nona Dell, MD   Chief Complaint  Patient presents with  . Palpitations    History of Present Illness: Hannah Singh is a 27 y.o. female referred for cardiology consultation by Dr. Clayborne Dana after ER evaluation in September for intermittent palpitations and chest pain and suspicion of possible PSVT.  No arrhythmias were documented during evaluation.  D-dimer, troponin I, BNP, and electrolytes were all normal.  She is here today for assessment.  We discussed her symptoms.  She describes intermittent and unpredictable episodes of chest discomfort, dull sensation sometimes under her left breast or into her left posterior thorax.  Sometimes more midline but she feels like this might be indigestion.  States that it does not feel like a sore muscle.  She also has been experiencing intermittent palpitations, description of rapid heart rate, sometimes with activities but not always.  She feels short of breath when she tries to exercise, although she has not been exercising regularly.  She does have an apple watch, states that heart rate will go from normal to rapid fairly quickly sometimes, she recorded a heart rate of "185" one evening when getting up to feed her child.  She had chest pain at that time as well.  She delivered her third child earlier this year, reports no obvious complications although had been experiencing some palpitations.  First 2 children were uncomplicated as well.  No history of peripartum cardiomyopathy.  No known history of asthma.  No family history of sudden cardiac death or arrhythmia.  Past Medical History:  Diagnosis Date  . Eczema     Past Surgical History:  Procedure Laterality Date  . ABDOMINAL SURGERY     Medications: No prescription medications.  Allergies:  Sertraline and Tape   Social History: The  patient  reports that she has never smoked. She has never used smokeless tobacco. She reports that she does not drink alcohol or use drugs.   Family History: The patient's family history includes Fibromyalgia in her mother; Stroke in her maternal aunt and maternal uncle.   ROS:  Please see the history of present illness. Otherwise, complete review of systems is positive for none.  All other systems are reviewed and negative.   Physical Exam: VS:  BP (!) 108/58   Pulse 78   Ht 5' (1.524 m)   Wt 127 lb (57.6 kg)   SpO2 99%   BMI 24.80 kg/m , BMI Body mass index is 24.8 kg/m.  Wt Readings from Last 3 Encounters:  02/16/18 127 lb (57.6 kg)  11/17/17 130 lb (59 kg)  10/05/17 131 lb (59.4 kg)    General: Patient appears comfortable at rest. HEENT: Conjunctiva and lids normal, oropharynx clear. Neck: Supple, no elevated JVP or carotid bruits, no thyromegaly. Lungs: Clear to auscultation, nonlabored breathing at rest. Cardiac: Regular rate and rhythm, no S3 or significant systolic murmur, no pericardial rub. Abdomen: Soft, nontender, bowel sounds present. Extremities: No pitting edema, distal pulses 2+. Skin: Warm and dry. Musculoskeletal: No kyphosis. Neuropsychiatric: Alert and oriented x3, affect grossly appropriate.  ECG: I personally reviewed the tracing from 11/17/2017 which showed sinus tachycardia with rightward axis.  Recent Labwork: 11/17/2017: ALT 16; AST 21; B Natriuretic Peptide 57.0; BUN 11; Creatinine, Ser 0.58; Hemoglobin 14.3; Platelets 294; Potassium 3.8; Sodium 138   Other Studies Reviewed Today:  Chest x-ray 11/17/2017: FINDINGS: Cardiomediastinal  silhouette is normal. Mediastinal contours appear intact.  There is no evidence of focal airspace consolidation, pleural effusion or pneumothorax.  Osseous structures are without acute abnormality. Soft tissues are grossly normal.  IMPRESSION: No active cardiopulmonary disease.  Assessment and Plan:  1.   Intermittent atypical chest pain.  Ischemic etiology is unlikely with overall low pretest probability of CAD.  She does have some shortness of breath with activities, although has not been exercising regularly.  Does have intermittent palpitations at times with her atypical chest pain.  She delivered her third child earlier this year.  We will obtain an echocardiogram to exclude cardiomyopathy although do not have high suspicion of this diagnosis.  2.  Intermittent rapid palpitations.  Possible paroxysmal arrhythmia to be considered.  Baseline ECG reviewed.  Plan is to obtain a 7-day cardiac monitor for further evaluation.  TSH was normal last year and recent screening lab work unremarkable.  Current medicines were reviewed with the patient today.   Orders Placed This Encounter  Procedures  . CARDIAC EVENT MONITOR  . ECHOCARDIOGRAM COMPLETE    Disposition: Call with test results.  Signed, Jonelle SidleSamuel G. Onie Kasparek, MD, [facility] Park[facility]ACC 02/16/2018 11:14 AM    [facility] [address], [place], [place] [address] Phone: [phone]; Fax: [phone]

## 2018-02-18 ENCOUNTER — Ambulatory Visit (HOSPITAL_COMMUNITY)
Admission: RE | Admit: 2018-02-18 | Discharge: 2018-02-18 | Disposition: A | Discharge status: Home / Self Care | Source: Ambulatory Visit | Attending: Cardiology | Admitting: Cardiology

## 2018-02-18 ENCOUNTER — Ambulatory Visit (INDEPENDENT_AMBULATORY_CARE_PROVIDER_SITE_OTHER)

## 2018-02-18 DIAGNOSIS — R0602 Shortness of breath: Secondary | ICD-10-CM | POA: Diagnosis present

## 2018-02-18 DIAGNOSIS — R002 Palpitations: Secondary | ICD-10-CM | POA: Diagnosis not present

## 2018-02-18 NOTE — Progress Notes (Signed)
*  PRELIMINARY RESULTS* Echocardiogram 2D Echocardiogram has been performed.  Stacey DrainWhite, Franklin Baumbach J 02/18/2018, 9:01 AM

## 2018-02-19 ENCOUNTER — Telehealth: Payer: Self-pay | Admitting: Cardiology

## 2018-02-19 NOTE — Telephone Encounter (Signed)
Results given to pt,copied pcp 

## 2018-02-19 NOTE — Telephone Encounter (Signed)
[place], returning her call   [phone]

## 2018-02-25 ENCOUNTER — Ambulatory Visit: Admitting: Allergy & Immunology

## 2019-02-26 ENCOUNTER — Ambulatory Visit (INDEPENDENT_AMBULATORY_CARE_PROVIDER_SITE_OTHER): Admitting: Allergy & Immunology

## 2019-02-26 ENCOUNTER — Encounter: Payer: Self-pay | Admitting: Allergy & Immunology

## 2019-02-26 ENCOUNTER — Other Ambulatory Visit: Payer: Self-pay

## 2019-02-26 VITALS — BP 92/58 | HR 89 | Temp 98.2°F | Resp 16 | Wt 127.2 lb

## 2019-02-26 DIAGNOSIS — T7800XD Anaphylactic reaction due to unspecified food, subsequent encounter: Secondary | ICD-10-CM

## 2019-02-26 DIAGNOSIS — J3089 Other allergic rhinitis: Secondary | ICD-10-CM

## 2019-02-26 DIAGNOSIS — R0602 Shortness of breath: Secondary | ICD-10-CM

## 2019-02-26 DIAGNOSIS — L2089 Other atopic dermatitis: Secondary | ICD-10-CM

## 2019-02-26 DIAGNOSIS — J302 Other seasonal allergic rhinitis: Secondary | ICD-10-CM

## 2019-02-26 NOTE — Patient Instructions (Addendum)
1. SOB (shortness of breath) - Lung testing looked normal today. - We did give you an albuterol treatment and it improved even more. - This does point towards asthma, so I think it would bew worth it to start a daily controller medication. - However we are starting Singulair for your allergies, which can also help with asthma.  - Spacer sample and demonstration provided. - Daily controller medication(s): Singulair 10mg  daily - Prior to physical activity: albuterol 2 puffs 10-15 minutes before physical activity. - Rescue medications: albuterol 4 puffs every 4-6 hours as needed - Asthma control goals:  * Full participation in all desired activities (may need albuterol before activity) * Albuterol use two time or less a week on average (not counting use with activity) * Cough interfering with sleep two time or less a month * Oral steroids no more than once a year * No hospitalizations  2. Seasonal and perennial allergic rhinitis - Testing today showed: horse, grasses, ragweed, weeds, trees, indoor molds, outdoor molds, dust mites, cat, dog and cockroach - Copy of test results provided.  - Avoidance measures provided. - Continue with: Zyrtec (cetirizine) 10mg  but increase to twice daily - Start taking: Singulair (montelukast) 10mg  daily and Flonase (fluticasone) one spray per nostril daily - You can use an extra dose of the antihistamine, if needed, for breakthrough symptoms.  - Consider nasal saline rinses 1-2 times daily to remove allergens from the nasal cavities as well as help with mucous clearance (this is especially helpful to do before the nasal sprays are given) - Consider allergy shots as a means of long-term control. - Allergy shots "re-train" and "reset" the immune system to ignore environmental allergens and decrease the resulting immune response to those allergens (sneezing, itchy watery eyes, runny nose, nasal congestion, etc).    - Allergy shots improve symptoms in 75-85% of  patients.  - We can discuss more at the next appointment if the medications are not working for you.  3. Anaphylactic shock due to food - Testing was slightly reactive to nearly the entire panel. - We are not going to confirm this with blood testing. - Your skin was just very reactive. - We will call you in 1 to 2 weeks for the results of the testing. - We will defer EpiPen training until the results return.   4. Atopic dermatitis - with possible coexisting contact dermatitis - Continue with moisturizing twice daily. - Increase cetirizine to 10mg  twice daily to help with itching/hives. - Consider patch testing to look for chemical sensitivities. - This consists of a Monday, Wednesday, and Friday visit.  - Take a picture of the baby oil concoction ingredients label and email to me ([email or web address]).  5. Return in about 4 weeks (around 03/26/2019). This can be an in-person, a virtual Webex or a telephone follow up visit.   Please inform Wednesday of any Emergency Department visits, hospitalizations, or changes in symptoms. Call Saturday before going to the ED for breathing or allergy symptoms since we might be able to fit you in for a sick visit. Feel free to contact Wednesday anytime with any questions, problems, or concerns.  It was a pleasure to meet you today!  Websites that have reliable patient information: 1. American Academy of Asthma, Allergy, and Immunology: www.aaaai.org 2. Food Allergy Research and Education (FARE): foodallergy.org 3. Mothers of Asthmatics: http://www.asthmacommunitynetwork.org 4. American College of Allergy, Asthma, and Immunology: www.acaai.org  "Like" 03/28/2019 on Facebook and Instagram for our latest updates!  Make sure you are registered to vote! If you have moved or changed any of your contact information, you will need to get this updated before voting!  In some cases, you MAY be able to register to vote online:  [email or web address]

## 2019-02-26 NOTE — Progress Notes (Signed)
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 SOB (shortness of breath) - Lung testing looked normal today. - We did give you an albuterol treatment and it improved even more. - This does point towards asthma, so I think it would bew worth it to start a daily controller medication. - However we are starting Singulair for your allergies, which can also help with asthma.  - Spacer sample and demonstration provided. - Daily controller medication(s): Singulair 10mg  daily - Prior to physical activity: albuterol 2 puffs 10-15 minutes before physical activity. - Rescue medications: albuterol 4 puffs every 4-6 hours as needed - Asthma control goals:  * Full participation in all desired activities (may need albuterol before activity) * Albuterol use two time or less a week on average (not counting use with activity) * Cough interfering with sleep two time or less a month * Oral steroids no more than once a year * No hospitalizations  2. Seasonal and perennial allergic rhinitis - Testing today showed: horse, grasses, ragweed, weeds, trees, indoor molds, outdoor molds, dust mites, cat, dog and cockroach - Copy of test results provided.  - Avoidance measures provided. - Continue with: Zyrtec (cetirizine) 10mg  but increase to twice daily - Start taking: Singulair (montelukast) 10mg  daily and Flonase (fluticasone) one spray per nostril daily - You can use  an extra dose of the antihistamine, if needed, for breakthrough symptoms.  - Consider nasal saline rinses 1-2 times daily to remove allergens from the nasal cavities as well as help with mucous clearance (this is especially helpful to do before the nasal sprays are given) - Consider allergy shots as a means of long-term control. - Allergy shots "re-train" and "reset" the immune system to ignore environmental allergens and decrease the resulting immune response to those allergens (sneezing, itchy watery eyes, runny nose, nasal congestion, etc).    - Allergy shots improve symptoms in 75-85% of patients.  - We can discuss more at the next appointment if the medications are not working for you.  3. Anaphylactic shock due to food - Testing was slightly reactive to nearly the entire panel. - We are not going to confirm this with blood testing. - Your skin was just very reactive. - We will call you in 1 to 2 weeks for the results of the testing. - We will defer EpiPen training until the results return.   4. Atopic dermatitis - with possible coexisting contact dermatitis - Continue with moisturizing twice daily. - Increase cetirizine to 10mg  twice daily to help with itching/hives. - Consider patch testing to look for chemical sensitivities. - This consists of a Monday, Wednesday, and Friday visit.  - Take a picture of the baby oil concoction ingredients label and email to me ([email or web address]).  5. Return in about 4 weeks (around 03/26/2019). This can be an in-person, a virtual Webex or a telephone follow up visit.   Subjective:   Hannah Singh is a 28 y.o. female presenting today for evaluation of  Chief Complaint  Patient presents with  .  Urticaria    baby oil, shae butter  . Eczema  . Food Intolerance    avocado, bananas nausea, burning sensation    Shanikka Wonders has a history of the following: Patient Active Problem List   Diagnosis Date Noted  . Seasonal and  perennial allergic rhinitis 02/27/2019  . Anaphylactic shock due to adverse food reaction 02/27/2019  . Flexural atopic dermatitis 02/27/2019    History obtained from: chart review and patient.  Hannah Score was referred by Estanislado Pandy, MD.     Ashlinn is a 28 y.o. female presenting for an evaluation of a multitude of complaints, but mostly centered on a rash.  She had a reaction around five weeks ago. At the time, she was trying to help her eczema and she ended up making it worse. She used baby oil and developed hives. The baby oil with shea butter is what she used. The rash was itchy. She used it on a Sunday night and then by Tuesday she had the hives on her chest and it progressed and worsened over the next few days. She went to see her PCP (Alyssa PA at [facility]). She was placed on prednisone for a few days.   She does have a long standing history of eczema. She has had it since she was a child. She typically does not need prednisone often for her eczema. Typically she uses a topical steroid cream. She did have a particular recalcitrant lesion on her neck which stuck around from May 2020 through November 2020, until she was placed on the steroid for the baby oil/shea butter concoction. She has triamcinolone that she uses very sparingly.   She does not know whether these are food related. She seems to tolerate all of the major food allergies without adverse event. She reports that the it took around 48 hours for her have the reaction to the baby oil/shea butter concoction. She denies any tick bites recently.   Asthma/Respiratory Symptom History: She does not have an inhaler but she does report some shortness of breath. These episodes are variable and she has noticed that winthin the past year after her pregnancy, her exercise tolerance has decreased. She did get worked up for chest pain around one year ago in December 2019 and saw Dr. Diona Browner in Cardiology; a workup was normal.    Allergic Rhinitis Symptom History: She was on Claritin but this was changed to Zyrtec. She has symptoms throughout the year. She will use some Benadryl very arely, but it causes drowsiness. She denies nasal congestion.  Food Allergy Symptom History: She does report that she has reactions to banana and avacado. After she had her second son, she was eating bananas and she would developed cramping and swelling. It took her a while for her to figure out what was causing this. Then she started having similar reactions with avacadoes. She reports that she was feeling sick and swollen and hurt.   Otherwise, there is no history of other atopic diseases, including environmental allergies, stinging insect allergies or contact dermatitis. There is no significant infectious history. Vaccinations are up to date.    Past Medical History: Patient Active Problem List   Diagnosis Date Noted  . Seasonal and perennial allergic rhinitis 02/27/2019  . Anaphylactic shock due to adverse food reaction 02/27/2019  . Flexural atopic dermatitis 02/27/2019    Medication List:  Allergies as of 02/26/2019      Reactions   Sertraline Other (See Comments)   Panic attack  Tape Hives      Medication List       Accurate as of February 26, 2019 11:59 PM. If you have any questions, ask your nurse or doctor.        albuterol 108 (90 Base) MCG/ACT inhaler Commonly known as: VENTOLIN HFA Inhale 2 puffs into the lungs every 6 (six) hours as needed for wheezing or shortness of breath. Started by: Valentina Shaggy, MD   Cetirizine HCl 10 MG Caps Take 10 mg by mouth daily.   montelukast 10 MG tablet Commonly known as: SINGULAIR Take 1 tablet (10 mg total) by mouth at bedtime. Started by: Valentina Shaggy, MD   norethindrone 0.35 MG tablet Commonly known as: MICRONOR Take by mouth.   pantoprazole 40 MG tablet Commonly known as: PROTONIX Take 40 mg by mouth daily.   triamcinolone ointment 0.1  % Commonly known as: KENALOG Apply 1 application topically 2 (two) times daily.       Birth History: non-contributory  Developmental History: non-contributory  Past Surgical History: Past Surgical History:  Procedure Laterality Date  . ABDOMINAL SURGERY       Family History: Family History  Problem Relation Age of Onset  . Fibromyalgia Mother   . Healthy Father   . Stroke Maternal Uncle   . Stroke Maternal Aunt   . Healthy Sister   . Healthy Brother   . Healthy Brother   . Healthy Brother   . Healthy Sister      Social History: Blakelyn lives at home with her husband and 3 boys.  She and her husband have been together for 10 years.  They live in a 27 year old home with a laminate flooring throughout.  They have gas heating and central cooling.  There are dogs and cats inside of the home.  They have no dust mite covers on the bedding.  There is no tobacco exposure.  She is a stay-at-home mom.  Her husband is in the [employer].   Review of Systems  Constitutional: Negative.  Negative for chills, fever, malaise/fatigue and weight loss.  HENT: Positive for congestion and sore throat. Negative for ear discharge and ear pain.   Eyes: Negative for pain, discharge and redness.  Respiratory: Negative for cough, sputum production, shortness of breath and wheezing.   Cardiovascular: Negative.  Negative for chest pain and palpitations.  Gastrointestinal: Negative for abdominal pain, constipation, diarrhea, heartburn, nausea and vomiting.  Skin: Positive for itching and rash.  Neurological: Negative for dizziness and headaches.  Endo/Heme/Allergies: Negative for environmental allergies. Does not bruise/bleed easily.       Objective:   Blood pressure (!) 92/58, pulse 89, temperature 98.2 F (36.8 C), temperature source Temporal, resp. rate 16, weight 127 lb 3.2 oz (57.7 kg), SpO2 100 %. Body mass index is 24.84 kg/m.   Physical Exam:   Physical Exam  Constitutional:  She appears well-developed.  HENT:  Head: Normocephalic and atraumatic.  Right Ear: Tympanic membrane, external ear and ear canal normal. No drainage, swelling or tenderness. Tympanic membrane is not injected, not scarred, not erythematous, not retracted and not bulging.  Left Ear: Tympanic membrane, external ear and ear canal normal. No drainage, swelling or tenderness. Tympanic membrane is not injected, not scarred, not erythematous, not retracted and not bulging.  Nose: Mucosal edema and rhinorrhea present. No nasal deformity or septal deviation. No epistaxis. Right sinus exhibits no maxillary sinus tenderness and no frontal sinus tenderness. Left sinus exhibits no maxillary sinus tenderness and no frontal  sinus tenderness.  Mouth/Throat: Uvula is midline and oropharynx is clear and moist. Mucous membranes are not pale and not dry.  Cobblestoning present in the posterior oropharynx.   Eyes: Pupils are equal, round, and reactive to light. Conjunctivae and EOM are normal. Right eye exhibits no chemosis and no discharge. Left eye exhibits no chemosis and no discharge. Right conjunctiva is not injected. Left conjunctiva is not injected.  Cardiovascular: Normal rate, regular rhythm and normal heart sounds.  Respiratory: Effort normal and breath sounds normal. No accessory muscle usage. No tachypnea. No respiratory distress. She has no wheezes. She has no rhonchi. She has no rales. She exhibits no tenderness.  Moving air well in all lung fields. No increased work of breathing noted.   GI: There is no abdominal tenderness. There is no rebound and no guarding.  Lymphadenopathy:       Head (right side): No submandibular, no tonsillar and no occipital adenopathy present.       Head (left side): No submandibular, no tonsillar and no occipital adenopathy present.    She has no cervical adenopathy.  Neurological: She is alert.  Skin: No abrasion, no petechiae and no rash noted. Rash is not papular, not  vesicular and not urticarial. No erythema. No pallor.  There are some dried patches isolated over her bilateral arms. She has a similarly dry patch over her neck and cheeks. There are no urticaria appreciated today. She is very dermatographic.   Psychiatric: She has a normal mood and affect.     Diagnostic studies:    Spirometry: results normal (FEV1: 2.05/76%, FVC: 2.88%, FEV1/FVC: 71%).    Spirometry consistent with normal pattern.  4 puffs of albuterol given with essentially no improvement in lung function.  However, her FEF 25-75% when at 13%.  She did not feel subjectively better.  Allergy Studies:  Airborne Adult Perc - 02/26/19 1501    Time Antigen Placed  1501    Allergen Manufacturer  Waynette Buttery    Location  Back    Number of Test  59    Panel 1  Select    1. Control-Buffer 50% Glycerol  Negative    2. Control-Histamine 1 mg/ml  2+    3. Albumin saline  Negative    4. Bahia  2+    5. [place]  3+    6. Johnson  2+    7. Kentucky Blue  Negative    8. Meadow Fescue  Negative    9. Perennial Rye  2+    10. Sweet Vernal  2+    11. Timothy  2+    12. Cocklebur  Negative    13. Burweed Marshelder  Negative    14. Ragweed, short  2+    15. Ragweed, Giant  2+    16. Plantain,  English  2+    17. Lamb's Quarters  --   +/-   18. Sheep Sorrell  Negative    19. Rough Pigweed  Negative    20. Marsh Elder, Rough  Negative    21. Mugwort, Common  Negative    22. Ash mix  2+    23. Birch mix  2+    24. Beech American  2+    25. Box, Elder  3+    26. Cedar, red  Negative    27. Cottonwood, Eastern  2+    28. Elm mix  2+    29. Hickory mix  3+    30. Maple mix  3+  31. Oak, Guinea-Bissau mix  3+    32. Pecan Pollen  Negative    33. Pine mix  Negative    34. Sycamore Eastern  Negative    35. Walnut, Black Pollen  3+    36. Alternaria alternata  2+    37. Cladosporium Herbarum  Negative    38. Aspergillus mix  Negative    39. Penicillium mix  2+    40. Bipolaris sorokiniana  (Helminthosporium)  Negative    41. Drechslera spicifera (Curvularia)  Negative    42. Mucor plumbeus  2+    43. Fusarium moniliforme  Negative    44. Aureobasidium pullulans (pullulara)  Negative    45. Rhizopus oryzae  2+    46. Botrytis cinera  Negative    47. Epicoccum nigrum  Negative    48. Phoma betae  Negative    49. Candida Albicans  2+    50. Trichophyton mentagrophytes  Negative    51. Mite, D Farinae  5,000 AU/ml  2+    52. Mite, D Pteronyssinus  5,000 AU/ml  2+    53. Cat Hair 10,000 BAU/ml  2+    54.  Dog Epithelia  2+    55. Mixed Feathers  Negative    56. Horse Epithelia  --   +/-   75. Cockroach, German  2+    58. Mouse  Negative    59. Tobacco Leaf  Negative     Food Adult Perc - 02/26/19 1500    Time Antigen Placed  1501    Allergen Manufacturer  Waynette Buttery    Location  Back    Number of allergen test  20    Panel 2  Select    Control-Histamine 1 mg/ml  2+    1. Peanut  2+    2. Soybean  2+    3. Wheat  2+    4. Sesame  2+    5. Milk, cow  Negative    6. Egg White, Chicken  Negative    7. Casein  2+    8. Shellfish Mix  2+    9. Fish Mix  Negative    10. Cashew  2+    11. Pecan Food  2+    12. Walnut Food  2+    13. Almond  Negative    14. Hazelnut  2+    15. [place] nut  Negative    16. Coconut  Negative    17. Pistachio  Negative    48. Avocado  2+    57. Banana  2+       Allergy testing results were read and interpreted by myself, documented by clinical staff.         Malachi Bonds, MD [facility]

## 2019-02-27 ENCOUNTER — Encounter: Payer: Self-pay | Admitting: Allergy & Immunology

## 2019-02-27 DIAGNOSIS — L2089 Other atopic dermatitis: Secondary | ICD-10-CM | POA: Insufficient documentation

## 2019-02-27 DIAGNOSIS — J302 Other seasonal allergic rhinitis: Secondary | ICD-10-CM | POA: Insufficient documentation

## 2019-02-27 DIAGNOSIS — T7800XA Anaphylactic reaction due to unspecified food, initial encounter: Secondary | ICD-10-CM | POA: Insufficient documentation

## 2019-02-27 DIAGNOSIS — J3089 Other allergic rhinitis: Secondary | ICD-10-CM | POA: Insufficient documentation

## 2019-02-27 MED ORDER — ALBUTEROL SULFATE HFA 108 (90 BASE) MCG/ACT IN AERS: 2.0000 | INHALATION_SPRAY | Freq: Four times a day (QID) | RESPIRATORY_TRACT | 1 refills | Status: AC | PRN

## 2019-02-27 MED ORDER — MONTELUKAST SODIUM 10 MG PO TABS: 10.0000 mg | ORAL_TABLET | Freq: Every day | ORAL | 5 refills | Status: DC

## 2019-03-09 LAB — IGE PEANUT COMPONENT PROFILE
F352-IgE Ara h 8: <0.1 kU/L
F422-IgE Ara h 1: <0.1 kU/L
F423-IgE Ara h 2: <0.1 kU/L
F424-IgE Ara h 3: <0.1 kU/L
F427-IgE Ara h 9: <0.1 kU/L
F447-IgE Ara h 6: <0.1 kU/L

## 2019-03-09 LAB — ALLERGEN PROFILE, BASIC FOOD
Allergen Corn, IgE: <0.1 kU/L
Beef IgE: <0.1 kU/L
Chocolate/Cacao IgE: <0.1 kU/L
Egg, Whole IgE: <0.1 kU/L
Food Mix (Seafoods) IgE: NEGATIVE
Milk IgE: <0.1 kU/L
Peanut IgE: <0.1 kU/L
Pork IgE: <0.1 kU/L
Soybean IgE: <0.1 kU/L
Wheat IgE: <0.1 kU/L

## 2019-03-09 LAB — ALPHA-GAL PANEL
Alpha Gal IgE*: <0.1 kU/L (ref <0.10)
Beef (Bos spp) IgE: <0.1 kU/L (ref <0.35)
Class Interpretation: 0
Class Interpretation: 0
Class Interpretation: 0
Lamb/Mutton (Ovis spp) IgE: <0.1 kU/L (ref <0.35)
Pork (Sus spp) IgE: <0.1 kU/L (ref <0.35)

## 2019-03-09 LAB — ALLERGY PANEL 18, NUT MIX GROUP
Allergen Coconut IgE: <0.1 kU/L
F020-IgE Almond: <0.1 kU/L
F202-IgE Cashew Nut: <0.1 kU/L
Hazelnut (Filbert) IgE: <0.1 kU/L
Pecan Nut IgE: <0.1 kU/L
Sesame Seed IgE: <0.1 kU/L

## 2019-03-09 LAB — ALLERGEN, BRAZIL NUT, F18: Brazil Nut IgE: <0.1 kU/L

## 2019-03-09 LAB — ALLERGEN WALNUT F256: Walnut IgE: <0.1 kU/L

## 2019-03-09 LAB — TRYPTASE: Tryptase: 4.3 ug/L (ref 2.2–13.2)

## 2019-03-09 LAB — ANA W/REFLEX IF POSITIVE: Anti Nuclear Antibody (ANA): NEGATIVE

## 2019-03-24 IMAGING — DX DG ANKLE COMPLETE 3+V*L*
3 series · 3 of 3 positions shown · non-contrast
Comparison: None.

CLINICAL DATA: Twisting injury with ankle pain, initial encounter

EXAM:
LEFT ANKLE COMPLETE - 3+ VIEW

[ankle ap]
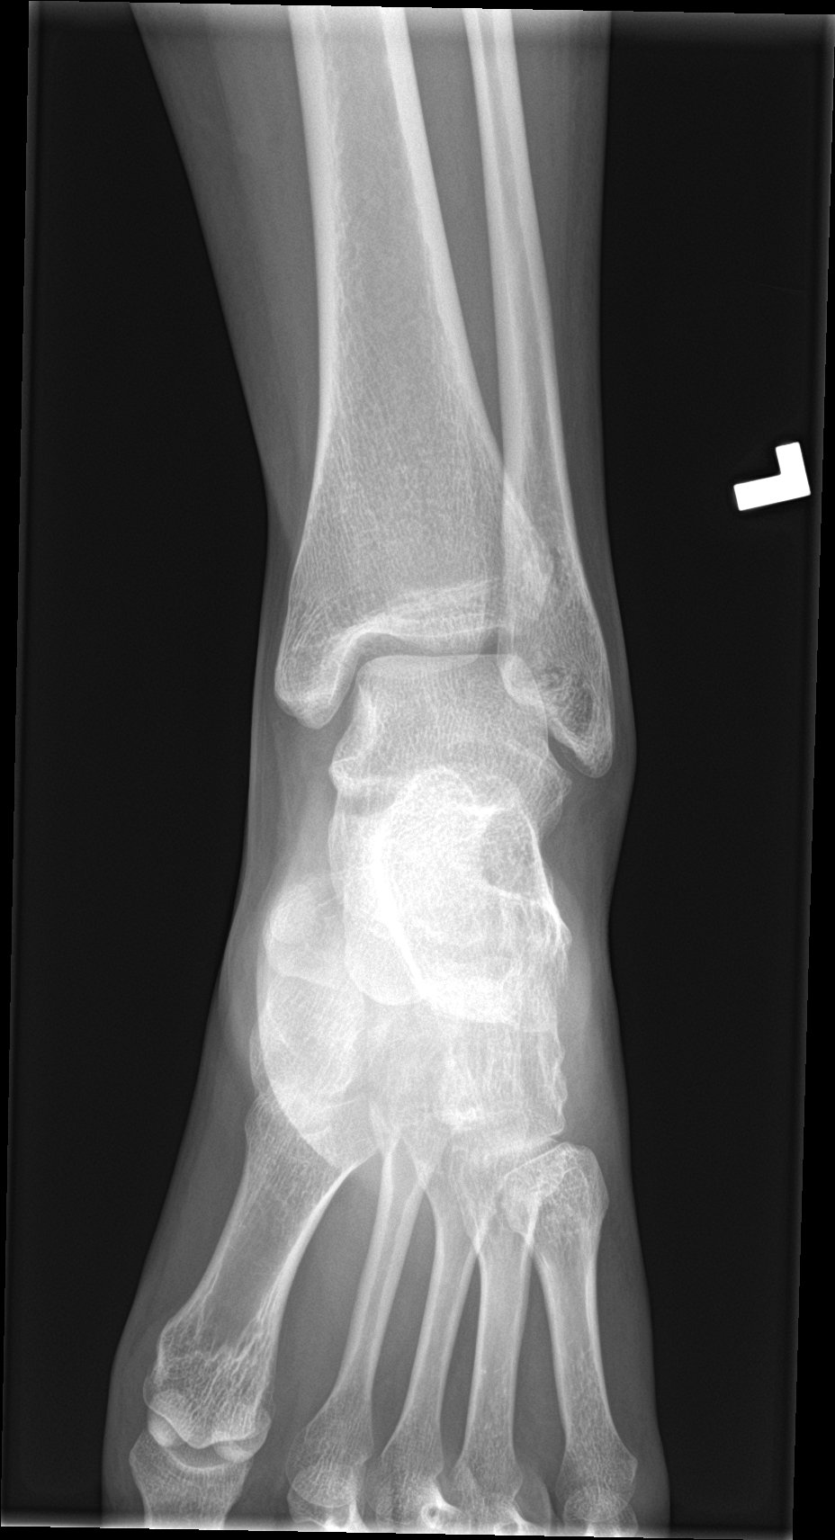

[ankle obl]
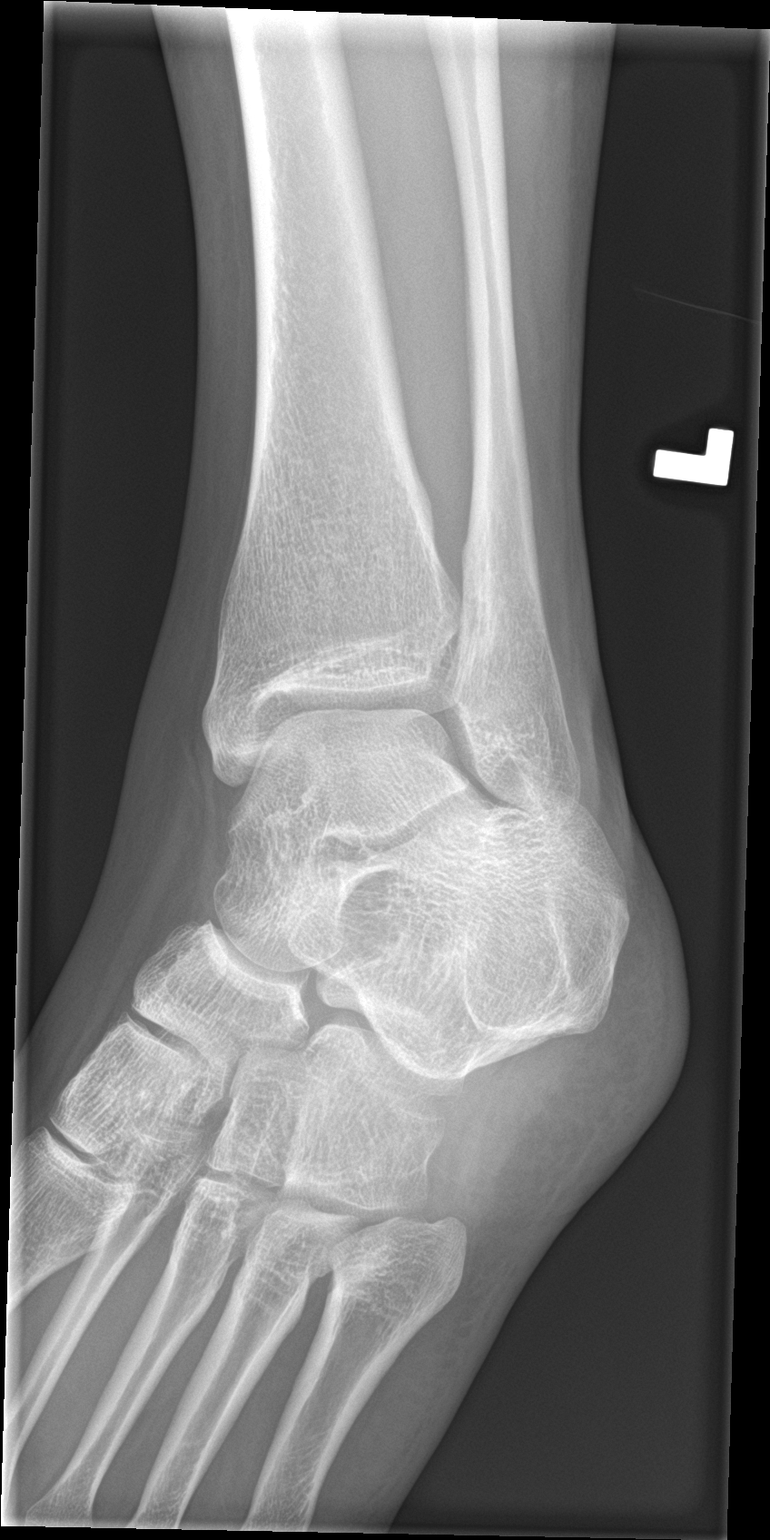

[ankle lat]
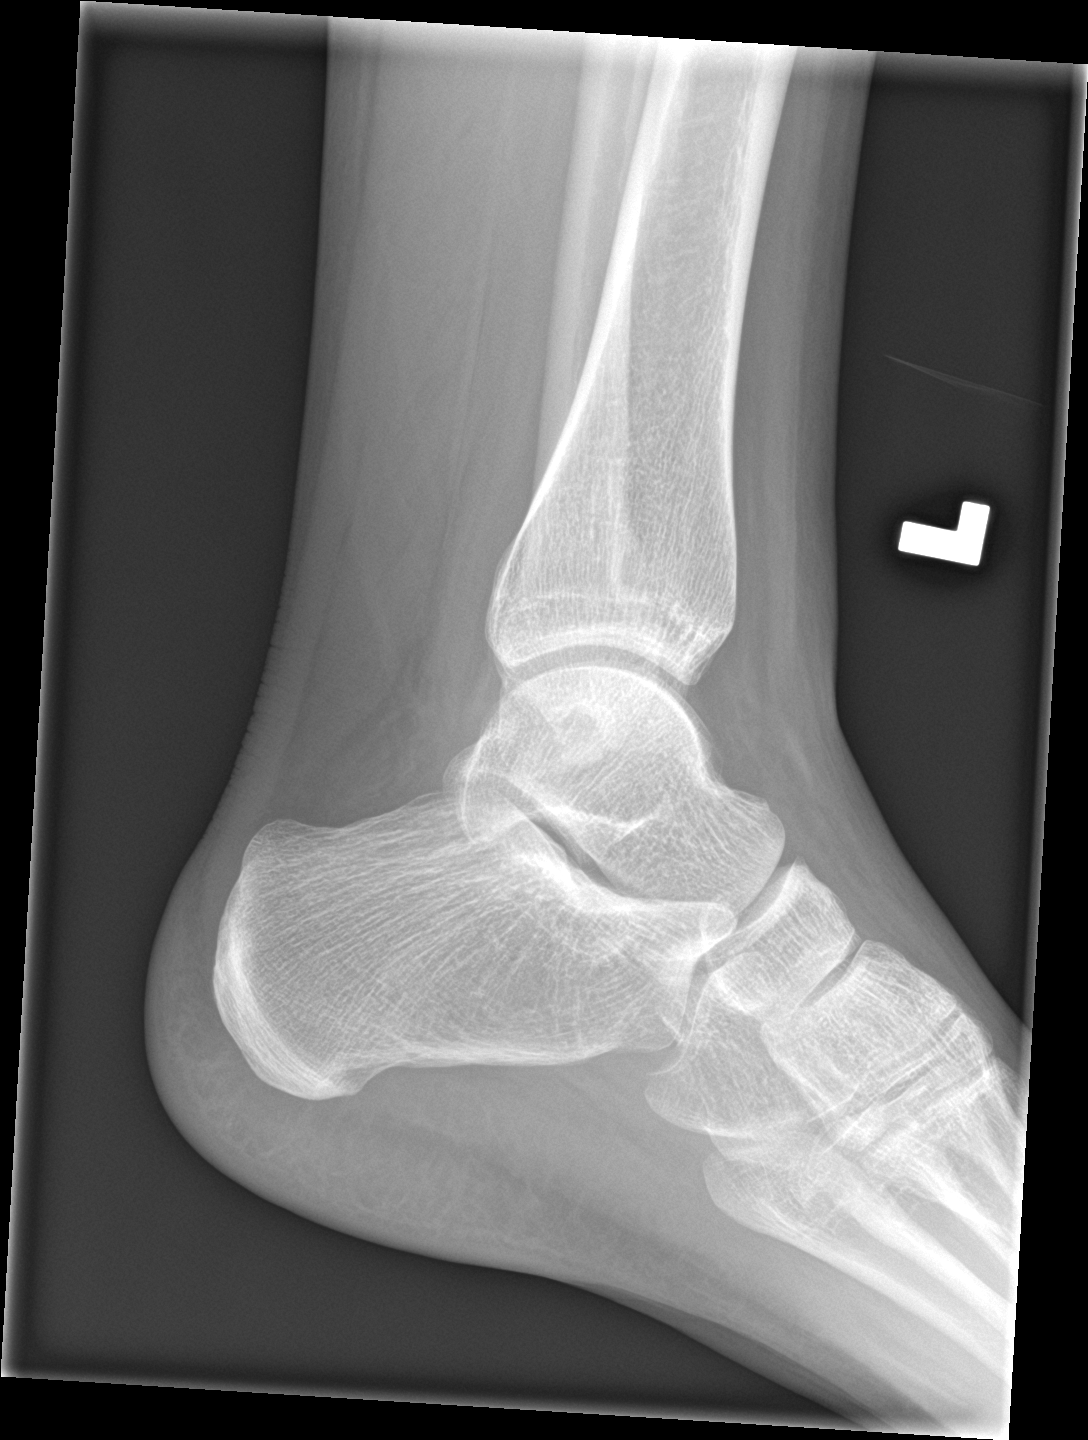

[3 of 3 positions shown; findings below may reference images not displayed]

FINDINGS: No acute fracture or dislocation is noted. No soft tissue changes
are seen.
IMPRESSION: No acute abnormality noted.

## 2019-04-02 ENCOUNTER — Ambulatory Visit: Admitting: Allergy & Immunology

## 2019-04-23 ENCOUNTER — Ambulatory Visit: Admitting: Allergy & Immunology

## 2019-08-16 ENCOUNTER — Other Ambulatory Visit: Payer: Self-pay

## 2019-08-16 ENCOUNTER — Encounter (HOSPITAL_COMMUNITY): Payer: Self-pay | Admitting: *Deleted

## 2019-08-16 ENCOUNTER — Emergency Department (HOSPITAL_COMMUNITY): Payer: Medicaid Other

## 2019-08-16 ENCOUNTER — Emergency Department (HOSPITAL_COMMUNITY)
Admission: EM | Admit: 2019-08-16 | Discharge: 2019-08-16 | Disposition: A | Discharge status: Home / Self Care | Payer: Medicaid Other | Attending: Emergency Medicine | Admitting: Emergency Medicine

## 2019-08-16 DIAGNOSIS — R0789 Other chest pain: Secondary | ICD-10-CM | POA: Diagnosis not present

## 2019-08-16 DIAGNOSIS — R0602 Shortness of breath: Secondary | ICD-10-CM | POA: Insufficient documentation

## 2019-08-16 DIAGNOSIS — K219 Gastro-esophageal reflux disease without esophagitis: Secondary | ICD-10-CM | POA: Insufficient documentation

## 2019-08-16 DIAGNOSIS — Z79899 Other long term (current) drug therapy: Secondary | ICD-10-CM | POA: Diagnosis not present

## 2019-08-16 LAB — BASIC METABOLIC PANEL
Anion gap: 12 (ref 5–15)
BUN: 10 mg/dL (ref 6–20)
CO2: 26 mmol/L (ref 22–32)
Calcium: 10 mg/dL (ref 8.9–10.3)
Chloride: 101 mmol/L (ref 98–111)
Creatinine, Ser: 0.71 mg/dL (ref 0.44–1.00)
GFR calc Af Amer: >60 mL/min (ref >60)
GFR calc non Af Amer: >60 mL/min (ref >60)
Glucose, Bld: 99 mg/dL (ref 70–99)
Potassium: 3.5 mmol/L (ref 3.5–5.1)
Sodium: 139 mmol/L (ref 135–145)

## 2019-08-16 LAB — CBC WITH DIFFERENTIAL/PLATELET
Abs Immature Granulocytes: 0.01 10*3/uL (ref 0.00–0.07)
Basophils Absolute: 0.1 10*3/uL (ref 0.0–0.1)
Basophils Relative: 1 %
Eosinophils Absolute: 0.2 10*3/uL (ref 0.0–0.5)
Eosinophils Relative: 2 %
HCT: 42.5 % (ref 36.0–46.0)
Hemoglobin: 14.3 g/dL (ref 12.0–15.0)
Immature Granulocytes: 0 %
Lymphocytes Relative: 28 %
Lymphs Abs: 2.2 10*3/uL (ref 0.7–4.0)
MCH: 30.1 pg (ref 26.0–34.0)
MCHC: 33.6 g/dL (ref 30.0–36.0)
MCV: 89.5 fL (ref 80.0–100.0)
Monocytes Absolute: 0.5 10*3/uL (ref 0.1–1.0)
Monocytes Relative: 6 %
Neutro Abs: 5.1 10*3/uL (ref 1.7–7.7)
Neutrophils Relative %: 63 %
Platelets: 362 10*3/uL (ref 150–400)
RBC: 4.75 MIL/uL (ref 3.87–5.11)
RDW: 11.2 % — ABNORMAL LOW (ref 11.5–15.5)
WBC: 8.1 10*3/uL (ref 4.0–10.5)
nRBC: 0 % (ref 0.0–0.2)

## 2019-08-16 LAB — D-DIMER, QUANTITATIVE: D-Dimer, Quant: <0.27 ug/mL-FEU (ref 0.00–0.50)

## 2019-08-16 LAB — TROPONIN I (HIGH SENSITIVITY)
Troponin I (High Sensitivity): 3 ng/L (ref <18)
Troponin I (High Sensitivity): 2 ng/L (ref <18)

## 2019-08-16 MED ORDER — PANTOPRAZOLE SODIUM 20 MG PO TBEC: 20.0000 mg | DELAYED_RELEASE_TABLET | Freq: Every day | ORAL | 0 refills | Status: AC

## 2019-08-16 NOTE — Discharge Instructions (Addendum)
As discussed, your exam, labs, chest x-ray and EKG are all normal today.  However given your symptom pattern, I am suspicious this could be the result of acid reflux disease.  I have prescribed Protonix for you.  I recommend a follow-up appointment with Dr. Neita Carp in the next 1 to 2 weeks to assess if the Protonix is working.  If not, you may need to consider further tests.  It is possible to have sleep apnea which may need to be further explored as well.

## 2019-08-16 NOTE — ED Triage Notes (Signed)
Pt here for CP, called [facility] and was told to come to ER.  Cp off and on for a month with SOB.  Pt drove to [place] recently.

## 2019-08-18 NOTE — ED Provider Notes (Signed)
[facility] EMERGENCY DEPARTMENT Provider Note   CSN: [id number] Arrival date & time: 08/16/19  1635     History Chief Complaint  Patient presents with  . Chest Pain    Hannah Singh is a 29 y.o. female with no significant past medical history presenting with intermittent chest discomfort, described as midsternal heaviness sensation, at times feels a burning sensation in her left upper back with these episodes and can last for hours.  She can have symptoms during the day, but has also noticed a pattern of waking up at night with coughing, sob with the back pain, but other nights is symptom free. No abd pain, no n/v.  Has recognized no other triggers, not worsened with exertion.   She describes a road trip through [place] several months ago and when they were in the higher elevations, she also became very sob, after which her symptoms started as described above since home.  She has had no fevers or chills, no recent febrile illnesses.  Denies extremity edema or pain.  She is not on exogenous hormones, is a nonsmoker. No family hx of PE or cardiac illness at an early age.  Of note, she was seen by Dr. Dellis Anes, an allergist 12/20 and had lung testing secondary to sob was felt to be allergy related, not asthma, but was given singulair and albuterol, however patient is not taking either medicine since she told this is not asthma.  She is on protonix, no recognized break through sx.  She has also been told she snores, has never been tested for sleep apnea.    HPI     Past Medical History:  Diagnosis Date  . Eczema     Patient Active Problem List   Diagnosis Date Noted  . Seasonal and perennial allergic rhinitis 02/27/2019  . Anaphylactic shock due to adverse food reaction 02/27/2019  . Flexural atopic dermatitis 02/27/2019    Past Surgical History:  Procedure Laterality Date  . ABDOMINAL SURGERY       OB History    Gravida  2   Para  2   Term  1   Preterm  1   AB       Living  3     SAB      TAB      Ectopic      Multiple      Live Births              Family History  Problem Relation Age of Onset  . Fibromyalgia Mother   . Healthy Father   . Stroke Maternal Uncle   . Stroke Maternal Aunt   . Healthy Sister   . Healthy Brother   . Healthy Brother   . Healthy Brother   . Healthy Sister     Social History   Tobacco Use  . Smoking status: Never Smoker  . Smokeless tobacco: Never Used  Substance Use Topics  . Alcohol use: No  . Drug use: No    Home Medications Prior to Admission medications   Medication Sig Start Date End Date Taking? Authorizing Provider  albuterol (VENTOLIN HFA) 108 (90 Base) MCG/ACT inhaler Inhale 2 puffs into the lungs every 6 (six) hours as needed for wheezing or shortness of breath. 02/27/19  Yes Alfonse Spruce, MD  calcium carbonate (TUMS - DOSED IN MG ELEMENTAL CALCIUM) 500 MG chewable tablet Chew 1 tablet by mouth daily as needed for indigestion or heartburn.   Yes [provider]  cetirizine (ZYRTEC) 10 MG tablet Take 10 mg by mouth daily.    Yes [provider]  pantoprazole (PROTONIX) 20 MG tablet Take 1 tablet (20 mg total) by mouth daily. 08/16/19   Evalee Jefferson, PA-C    Allergies    Sertraline and Tape  Review of Systems   Review of Systems  Constitutional: Negative for chills and fever.  HENT: Negative for congestion and sore throat.   Eyes: Negative.   Respiratory: Positive for cough, chest tightness and shortness of breath.   Cardiovascular: Negative for chest pain, palpitations and leg swelling.  Gastrointestinal: Negative for abdominal pain, nausea and vomiting.  Genitourinary: Negative.   Musculoskeletal: Positive for back pain. Negative for arthralgias, joint swelling and neck pain.  Skin: Negative.  Negative for rash and wound.  Neurological: Negative for dizziness, weakness, light-headedness, numbness and headaches.  Psychiatric/Behavioral: Negative.      Physical Exam Updated Vital Signs BP 118/75   Pulse 72   Temp 98.3 F (36.8 C) (Oral)   Resp 18   Ht 5' (1.524 m)   Wt 56.7 kg   LMP 08/12/2019   SpO2 100%   BMI 24.41 kg/m   Physical Exam Vitals and nursing note reviewed.  Constitutional:      Appearance: She is well-developed.  HENT:     Head: Normocephalic and atraumatic.  Eyes:     Conjunctiva/sclera: Conjunctivae normal.  Cardiovascular:     Rate and Rhythm: Normal rate and regular rhythm.     Heart sounds: Normal heart sounds.  Pulmonary:     Effort: Pulmonary effort is normal. No tachypnea or respiratory distress.     Breath sounds: Normal breath sounds. No stridor. No wheezing, rhonchi or rales.  Abdominal:     General: Bowel sounds are normal.     Palpations: Abdomen is soft.     Tenderness: There is no abdominal tenderness.  Musculoskeletal:        General: Normal range of motion.     Cervical back: Normal range of motion.     Right lower leg: No tenderness. No edema.     Left lower leg: No tenderness. No edema.  Skin:    General: Skin is warm and dry.  Neurological:     General: No focal deficit present.     Mental Status: She is alert.     ED Results / Procedures / Treatments   Labs (all labs ordered are listed, but only abnormal results are displayed) Labs Reviewed  CBC WITH DIFFERENTIAL/PLATELET - Abnormal; Notable for the following components:      Result Value   RDW 11.2 (*)    All other components within normal limits  BASIC METABOLIC PANEL  D-DIMER, QUANTITATIVE (NOT AT [facility])  TROPONIN I (HIGH SENSITIVITY)  TROPONIN I (HIGH SENSITIVITY)    EKG EKG Interpretation  Date/Time:  Monday August 16 2019 16:48:29 EDT Ventricular Rate:  89 PR Interval:  124 QRS Duration: 76 QT Interval:  348 QTC Calculation: 423 R Axis:   94 Text Interpretation: Normal sinus rhythm Rightward axis Borderline ECG No STEMI Confirmed by Nanda Quinton [phone]) on 08/16/2019 5:12:35 PM   Radiology DG Chest 2  View  Result Date: 08/16/2019 CLINICAL DATA:  Chest pain and shortness of breath EXAM: CHEST - 2 VIEW COMPARISON:  November 17, 2017 FINDINGS: Lungs are clear. Heart size and pulmonary vascularity are normal. No adenopathy. No pneumothorax. No bone lesions. IMPRESSION: Lungs clear.  Cardiac silhouette within normal limits. Electronically Signed   By:  Bretta Bang III M.D.   On: 08/16/2019 18:00    Procedures Procedures (including critical care time)  Medications Ordered in ED Medications - No data to display  ED Course  I have reviewed the triage vital signs and the nursing notes.  Pertinent labs & imaging results that were available during my care of the patient were reviewed by me and considered in my medical decision making (see chart for details).    MDM Rules/Calculators/A&P                      Pt with intermittent chronic cp and sob with history of gerd.  With burning pain in upper back with episodes,  Possible reflux equivalent.  She is perc negative with a negative d dimer - this is not PE/dvt.  Has taken protonix in the past, prescribed today with plan to f/u with pcp for a recheck.  Consider GI eval/endoscopy if sx persist.  Also discussed possible sleep apnea since she wakes sob and is known to snore.  Might explore sleep study if sx persist.  Troponins negative, ekg unremarkable.  This is not acs.  Final Clinical Impression(s) / ED Diagnoses Final diagnoses:  Shortness of breath  Gastroesophageal reflux disease, unspecified whether esophagitis present  Atypical chest pain    Rx / DC Orders ED Discharge Orders         Ordered    pantoprazole (PROTONIX) 20 MG tablet  Daily     08/16/19 2159           Burgess Amor, PA-C 08/18/19 1620    Long, Arlyss Repress, MD 08/19/19 1410

## 2021-02-01 IMAGING — DX DG CHEST 2V
2 series · 2 of 2 positions shown · non-contrast
Comparison: November 17, 2017

CLINICAL DATA: Chest pain and shortness of breath

EXAM:
CHEST - 2 VIEW

[chest pa]
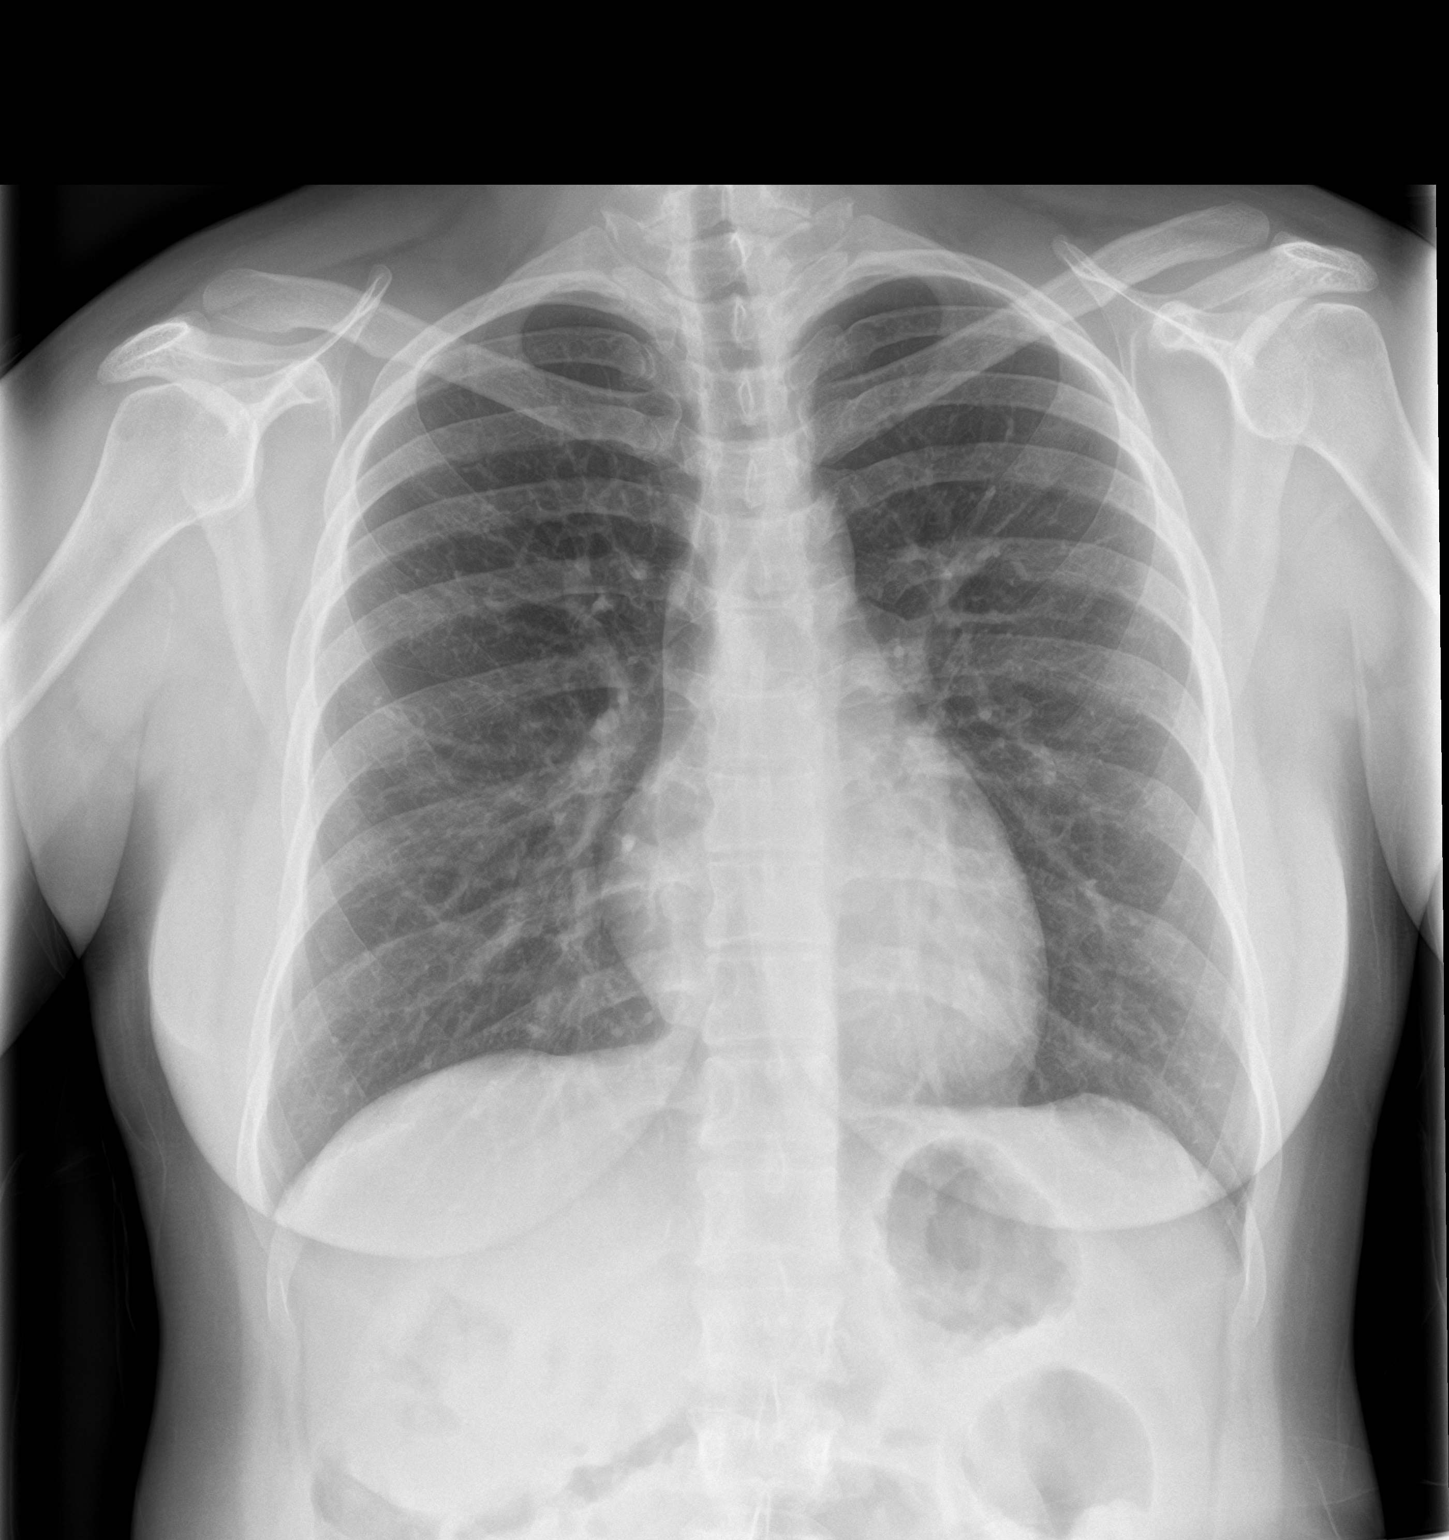

[chest lat]
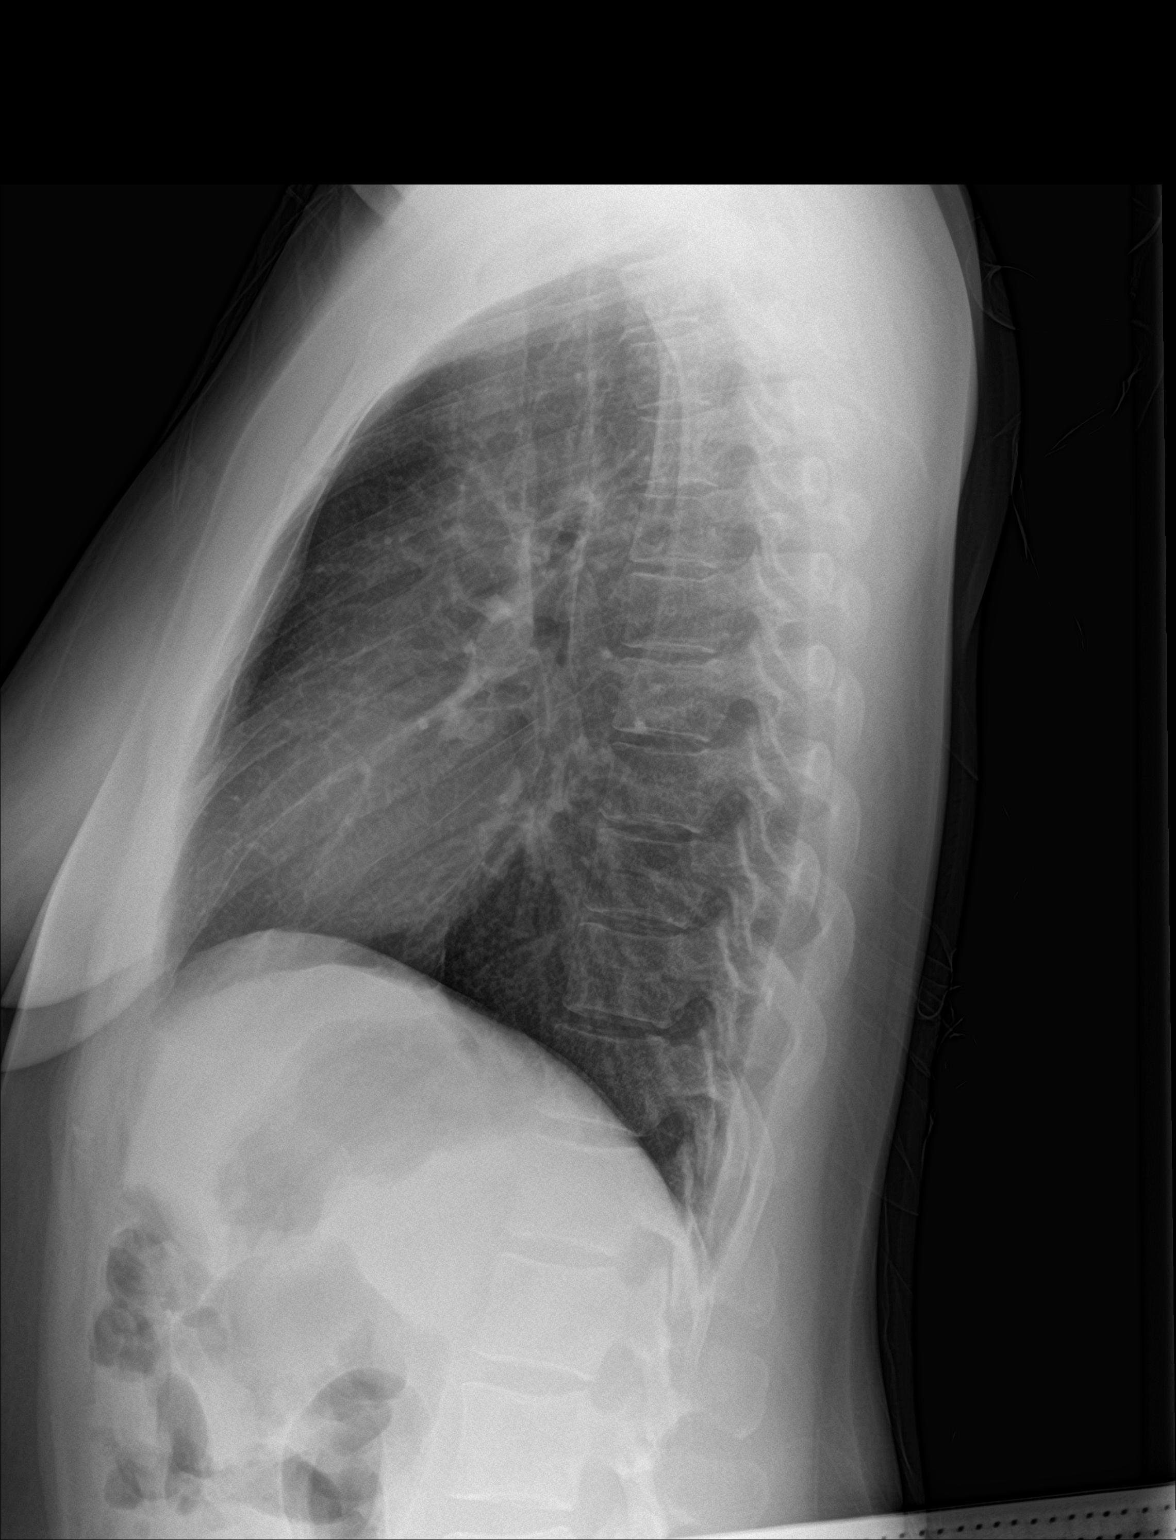

[2 of 2 positions shown; findings below may reference images not displayed]

FINDINGS: Lungs are clear. Heart size and pulmonary vascularity are normal. No
adenopathy. No pneumothorax. No bone lesions.
IMPRESSION: Lungs clear.  Cardiac silhouette within normal limits.
